# Patient Record
Sex: Female | Born: 1960 | Race: White | Hispanic: No | Smoking: Never smoker
Health system: Southern US, Community
[De-identification: ages and names within clinical notes are randomized; demographics above are authoritative.]

## PROBLEM LIST (undated history)

## (undated) DIAGNOSIS — H43819 Vitreous degeneration, unspecified eye: Secondary | ICD-10-CM

## (undated) DIAGNOSIS — F909 Attention-deficit hyperactivity disorder, unspecified type: Secondary | ICD-10-CM

## (undated) DIAGNOSIS — F32A Depression, unspecified: Secondary | ICD-10-CM

## (undated) DIAGNOSIS — M1990 Unspecified osteoarthritis, unspecified site: Secondary | ICD-10-CM

## (undated) HISTORY — DX: Unspecified osteoarthritis, unspecified site: M19.90

## (undated) HISTORY — DX: Attention-deficit hyperactivity disorder, unspecified type: F90.9

## (undated) HISTORY — DX: Depression, unspecified: F32.A

## (undated) HISTORY — DX: Vitreous degeneration, unspecified eye: H43.819

---
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
## 2000-09-08 HISTORY — PX: STRABISMUS SURGERY: SHX218

## 2015-09-09 HISTORY — PX: TOTAL VAGINAL HYSTERECTOMY: SHX2548

## 2020-02-28 ENCOUNTER — Ambulatory Visit: Payer: 59 | Admitting: Podiatry

## 2020-03-22 ENCOUNTER — Ambulatory Visit (INDEPENDENT_AMBULATORY_CARE_PROVIDER_SITE_OTHER): Payer: 59

## 2020-03-22 ENCOUNTER — Other Ambulatory Visit: Payer: Self-pay

## 2020-03-22 ENCOUNTER — Ambulatory Visit (INDEPENDENT_AMBULATORY_CARE_PROVIDER_SITE_OTHER): Payer: 59 | Admitting: Podiatry

## 2020-03-22 DIAGNOSIS — M2141 Flat foot [pes planus] (acquired), right foot: Secondary | ICD-10-CM

## 2020-03-22 DIAGNOSIS — M779 Enthesopathy, unspecified: Secondary | ICD-10-CM

## 2020-03-22 DIAGNOSIS — M2142 Flat foot [pes planus] (acquired), left foot: Secondary | ICD-10-CM | POA: Diagnosis not present

## 2020-03-22 DIAGNOSIS — M79671 Pain in right foot: Secondary | ICD-10-CM

## 2020-03-22 DIAGNOSIS — M19079 Primary osteoarthritis, unspecified ankle and foot: Secondary | ICD-10-CM | POA: Diagnosis not present

## 2020-03-22 DIAGNOSIS — M79672 Pain in left foot: Secondary | ICD-10-CM

## 2020-03-27 ENCOUNTER — Other Ambulatory Visit: Payer: Self-pay | Admitting: Podiatry

## 2020-03-27 DIAGNOSIS — M779 Enthesopathy, unspecified: Secondary | ICD-10-CM

## 2020-03-27 NOTE — Progress Notes (Signed)
Subjective:   Patient ID: Carmen Coleman, female   DOB: 59 y.o.   MRN: [id number]   HPI 59 year old female presents the office today for concerns of chronic bilateral lower extremity pain mostly to the ankles as well as her flatfeet.  She states that she has difficulties with walking and she has difficulty able to stand for long period of time.  She has tried orthotics and good supportive shoes which helped some but she still gets pain on daily basis mostly to the ankle but the right side worse than left.  This seems to be getting worse over time.  Denies any recent injury or trauma.  She previous has been told that she has arthritis in both of her feet and ankles.   Review of Systems  All other systems reviewed and are negative.  No past medical history on file.  No current outpatient medications on file.  Not on File       Objective:  Physical Exam  General: AAO x3, NAD  Dermatological: Skin is warm, dry and supple bilateral. Nails x 10 are well manicured; remaining integument appears unremarkable at this time. There are no open sores, no preulcerative lesions, no rash or signs of infection present.  Vascular: Dorsalis Pedis artery and Posterior Tibial artery pedal pulses are 2/4 bilateral with immedate capillary fill time. Pedal hair growth present. No varicosities and no lower extremity edema present bilateral. There is no pain with calf compression, swelling, warmth, erythema.   Neruologic: Grossly intact via light touch bilateral.  Musculoskeletal: There is a decrease in medial arch height upon weightbearing bilaterally.  There is discomfort mostly to the anterior aspect of the ankle joint with the right side worse than left.  There is decreased range of motion of the ankle joint.  Decreased abduction range of motion.  No areas of pinpoint tenderness otherwise.  Muscular strength 5/5 in all groups tested bilateral.  Gait: Unassisted, Nonantalgic.      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  Flatfoot is evident with arthritic changes present the midfoot as well as the ankle joint.  Not able to appreciate any evidence of acute fracture. -Steroid injection performed to the right ankle.  See procedure note below. -I will have her follow-up with Raiford Noble for possible bracing.  She has tried orthotics without significant improvement most of her pain seems to be in the ankle itself.  Procedure: Injection Intermediate Joint Discussed alternatives, risks, complications and verbal consent was obtained.  Location: Right ankle  Skin Prep: Betadine. Injectate: 0.5cc 0.5% marcaine plain, 0.5 cc 2% lidocaine plain and, 1 cc kenalog 10. Disposition: Patient tolerated procedure well. Injection site dressed with a band-aid.  Post-injection care was discussed and return precautions discussed.   No follow-ups on file.  Vivi Barrack DPM

## 2020-04-02 ENCOUNTER — Ambulatory Visit: Payer: 59 | Admitting: Orthotics

## 2020-04-02 ENCOUNTER — Other Ambulatory Visit: Payer: Self-pay

## 2020-04-02 DIAGNOSIS — M779 Enthesopathy, unspecified: Secondary | ICD-10-CM

## 2020-04-02 DIAGNOSIS — M19079 Primary osteoarthritis, unspecified ankle and foot: Secondary | ICD-10-CM

## 2020-04-02 DIAGNOSIS — M2142 Flat foot [pes planus] (acquired), left foot: Secondary | ICD-10-CM

## 2020-04-02 DIAGNOSIS — M2141 Flat foot [pes planus] (acquired), right foot: Secondary | ICD-10-CM | POA: Diagnosis not present

## 2020-04-02 NOTE — Progress Notes (Addendum)
Patient presents today for ankle AFO (gauntlet type)  to address chronic mid foot/rear foot OA pain.   Patient is fairly rigid in transverse/saggital planes.  Brace will be more accomodative type to lock foot/ankle in place and limit painful joint movement

## 2020-04-04 ENCOUNTER — Other Ambulatory Visit: Payer: Self-pay

## 2020-04-04 ENCOUNTER — Ambulatory Visit (HOSPITAL_COMMUNITY)
Admission: EM | Admit: 2020-04-04 | Discharge: 2020-04-04 | Disposition: A | Discharge status: Home / Self Care | Payer: 59

## 2020-04-04 DIAGNOSIS — Z760 Encounter for issue of repeat prescription: Secondary | ICD-10-CM | POA: Diagnosis not present

## 2020-04-04 DIAGNOSIS — G4700 Insomnia, unspecified: Secondary | ICD-10-CM | POA: Diagnosis not present

## 2020-04-04 DIAGNOSIS — F411 Generalized anxiety disorder: Secondary | ICD-10-CM

## 2020-04-04 NOTE — BH Assessment (Signed)
Comprehensive Clinical Assessment (CCA) Screening, Triage and Referral Note  04/04/2020 Carmen Coleman [id number]   Patient is a 58 y.o. female with a history of ADHD and R/O diagnosis if Bipolar Disorder (which she questions) who presents to [facility] requesting a prescription for Vyvanse.  Patient has been prescribed Vyvanse for ADHD for 12 or more years by her PCP in [place].  She recently resigned from [employer] in [place] and moved to [place].  When she contacted Dr. Manson Passey for a refill, she learned that he is "no longer allowed to prescribe vyvanse" per the [facility] instructions.  Patient states she is also prescribed Trazadone, which she doesn't take daily and has refills for.  She reports she has been diagnosed with Bipolar Disorder in the past, and had tried multiple medications that were not helpful.  As such, she does not agree with this diagnosis.  She reports she has been depressed for "years" and the only medication that has helped has been Vyvanse.  She states it relieves symptoms of anxiety and depression, while also improving her ability to focus. Patient denies SI, HI and AVH.  She does present with hypomanic symptoms to include, loud/pressured speech, tangential thought process, difficulty focusing on multiple tasks at once and racing thoughts.  She attributes symptoms to ADHD.    Patient has been trying to find a provider here in [place], however she has had difficulty finding someone who could see her before her Rx runs out tomorrow.  Also, her [facility] insurance coverage ends on 04/07/20, so she was hoping to get a refill before insurance lapses.  She contacted [facility] today and was transferred to the [facility].  She states someone here told her that she could get a prescription for this medication, a controlled substance.  Patient was informed by Marciano Sequin, NP that Vyvanse could not be prescribed at urgent care/walk-in clinic. Patient has been scheduled for an intake appointment  with [facility] clinic for 8/20 at 11AM.  She is informed she may contact staff tomorrow, as it is after hours currently, to discuss being considered for a sooner appointment.    Visit Diagnosis:    ICD-10-CM   1. Medication refill  Z76.0     Patient Reported Information How did you hear about [place]? Self   Referral name: No data recorded  Referral phone number: No data recorded Whom do you see for routine medical problems? I don't have a doctor   Practice/Facility Name: No data recorded  Practice/Facility Phone Number: No data recorded  Name of Contact: No data recorded  Contact Number: No data recorded  Contact Fax Number: No data recorded  Prescriber Name: No data recorded  Prescriber Address (if known): No data recorded What Is the Reason for Your Visit/Call Today? Patient reports she will run out of vyvanse she has been prescribed for the pat 12 yrs.  She states her provider in [place] can't continue to prescribe and she requests a prescription.  How Long Has This Been Causing You Problems? <Week  Have You Recently Been in Any Inpatient Treatment (Hospital/Detox/Crisis Center/28-Day Program)? No   Name/Location of Program/Hospital:No data recorded  How Long Were You There? No data recorded  When Were You Discharged? No data recorded Have You Ever Received Services From [facility] Before? No   Who Do You See at [facility]? No data recorded Have You Recently Had Any Thoughts About Hurting Yourself? No   Are You Planning to Commit Suicide/Harm Yourself At  This time?  No  Have you Recently Had Thoughts About Hurting Someone Karolee Ohs? No   Explanation: No data recorded Have You Used Any Alcohol or Drugs in the Past 24 Hours? No   How Long Ago Did You Use Drugs or Alcohol?  No data recorded  What Did You Use and How Much? No data recorded What Do You Feel Would Help You the Most Today? Medication  Do You Currently Have a Therapist/Psychiatrist?  No   Name of Therapist/Psychiatrist: No data recorded  Have You Been Recently Discharged From Any Office Practice or Programs? Yes   Explanation of Discharge From Practice/Program:  Provider, Darlis Loan in [place], no longer "allowed" to rx vyvanse per DEA.     CCA Screening Triage Referral Assessment Type of Contact: Face-to-Face   Is this Initial or Reassessment? No data recorded  Date Telepsych consult ordered in [facility]:  No data recorded  Time Telepsych consult ordered in [facility]:  No data recorded Patient Reported Information Reviewed? Yes   Patient Left Without Being Seen? No data recorded  Reason for Not Completing Assessment: No data recorded Collateral Involvement: N/A  Does Patient Have a Court Appointed Legal Guardian? No data recorded  Name and Contact of Legal Guardian:  No data recorded If Minor and Not Living with Parent(s), Who has Custody? No data recorded Is CPS involved or ever been involved? Never  Is APS involved or ever been involved? Never  Patient Determined To Be At Risk for Harm To Self or Others Based on Review of Patient Reported Information or Presenting Complaint? No   Method: No data recorded  Availability of Means: No data recorded  Intent: No data recorded  Notification Required: No data recorded  Additional Information for Danger to Others Potential:  No data recorded  Additional Comments for Danger to Others Potential:  No data recorded  Are There Guns or Other Weapons in Your Home?  No data recorded   Types of Guns/Weapons: No data recorded   Are These Weapons Safely Secured?                              No data recorded   Who Could Verify You Are Able To Have These Secured:    No data recorded Do You Have any Outstanding Charges, Pending Court Dates, Parole/Probation? No data recorded Contacted To Inform of Risk of Harm To Self or Others: No data recorded Location of Assessment: [facility]  Does Patient Present under  Involuntary Commitment? No   IVC Papers Initial File Date: No data recorded  [place] of Residence: [place]  Patient Currently Receiving the Following Services: No data recorded  Determination of Need: Routine (7 days)   Options For Referral: Medication Management   Yetta Glassman

## 2020-04-04 NOTE — ED Provider Notes (Signed)
Behavioral Health Medical Screening Exam  Carmen Coleman is a 59 y.o. female. She is presenting requesting prescription for Vyvanse. She states she recently quit her job in [place] and moved to [place] to live with her sister. She states her PCP in [place] will fill her other prescriptions over the phone but refused to fill Vyvanse prescription.  Patient appears hypomanic, with pressured, loud speech and tangential thinking. She reports history of diagnosis with bipolar disorder but "I don't have that," and states Vyvanse is the only medication that helps her. She strongly denies any SI/HI/AVH.   Patient advised that we do not prescribe controlled substances through walk-in clinic but can provide referrals for outpatient medication management. She reports that she will be losing her health insurance on July 31 due to recently quitting her job.  Total Time spent with patient: 15 minutes  Psychiatric Specialty Exam  Presentation  General Appearance:Casual  Eye Contact:Good  Speech:Pressured  Speech Volume:Increased  Handedness:Right   Mood and Affect  Mood:Anxious;Labile  Affect:Congruent   Thought Process  Thought Processes:Coherent  Descriptions of Associations:Tangential  Orientation:Full (Time, Place and Person)  Thought Content:Logical  Hallucinations:None  Ideas of Reference:None  Suicidal Thoughts:No  Homicidal Thoughts:No   Sensorium  Memory:Immediate Good;Recent Good;Remote Good  Judgment:Fair  Insight:Fair   Executive Functions  Concentration:Fair  Attention Span:Fair  Recall:Fair  Fund of Knowledge:Fair  Language:Fair   Psychomotor Activity  Psychomotor Activity:Normal   Assets  Assets:Communication Skills;Desire for Improvement;Housing;Social Support   Sleep  Sleep:Fair  Number of hours: No data recorded  Physical Exam: Physical Exam Vitals reviewed.  Constitutional:      Appearance: She is well-developed.   Cardiovascular:     Rate and Rhythm: Normal rate.  Pulmonary:     Effort: Pulmonary effort is normal.  Neurological:     Mental Status: She is alert and oriented to person, place, and time.    Review of Systems  Constitutional: Negative.   Respiratory: Negative for cough and shortness of breath.   Cardiovascular: Negative for chest pain.  Psychiatric/Behavioral: Positive for depression (chronic). Negative for hallucinations, memory loss, substance abuse and suicidal ideas. The patient is nervous/anxious and has insomnia.    Blood pressure (!) 136/76, pulse 98, temperature 98.6 F (37 C), temperature source Tympanic, height 5' 4.5" (1.638 m), weight (!) 210 lb (95.3 kg), SpO2 99 %. Body mass index is 35.49 kg/m.  Musculoskeletal: Strength & Muscle Tone: within normal limits Gait & Station: normal Patient leans: N/A   Recommendations:  Based on my evaluation the patient does not appear to have an emergency medical condition.  Outpatient referrals for medication management.  Aldean Baker, NP 04/04/2020, 5:00 PM

## 2020-04-05 ENCOUNTER — Telehealth: Payer: Self-pay | Admitting: Podiatry

## 2020-04-05 NOTE — Telephone Encounter (Signed)
Left message for pt that the braces does not need auth and covered @ 80% after deductible.

## 2020-04-09 NOTE — Telephone Encounter (Signed)
Pt called asking about the benefits for the brace.   I explained I had left her a message that they are covered @ 80% after deductible and no auth was needed. Pt wants to proceed with the braces and I told her I would call when they come in to schedule an appt to pick them up.

## 2020-04-27 ENCOUNTER — Telehealth (HOSPITAL_COMMUNITY): Payer: 59

## 2020-05-01 ENCOUNTER — Telehealth: Payer: Self-pay | Admitting: Podiatry

## 2020-05-01 NOTE — Telephone Encounter (Signed)
Pt left 2 messages 1 @ 218 and 1 @ 216 asking for a call back.   I returned call and left message for pt to call me tomorrow.

## 2020-05-04 ENCOUNTER — Telehealth: Payer: Self-pay | Admitting: Podiatry

## 2020-05-04 NOTE — Telephone Encounter (Signed)
Pt left message asking for a call back.  I returned call and left message it looks like it was returning a call to schedule an appt to pick up brace and she called back and is already scheduled. But if she needs any other assistance to please call me back.

## 2020-06-04 ENCOUNTER — Ambulatory Visit: Payer: 59 | Admitting: Orthotics

## 2020-06-04 ENCOUNTER — Other Ambulatory Visit: Payer: Self-pay

## 2020-06-04 DIAGNOSIS — M779 Enthesopathy, unspecified: Secondary | ICD-10-CM

## 2020-06-04 DIAGNOSIS — M2142 Flat foot [pes planus] (acquired), left foot: Secondary | ICD-10-CM

## 2020-06-04 DIAGNOSIS — M2141 Flat foot [pes planus] (acquired), right foot: Secondary | ICD-10-CM

## 2020-06-04 DIAGNOSIS — M19079 Primary osteoarthritis, unspecified ankle and foot: Secondary | ICD-10-CM

## 2020-06-04 NOTE — Progress Notes (Signed)
Patient picked up b/l [place] gauntlet style braces.  She was very pleased with fit and trim.

## 2020-06-26 ENCOUNTER — Telehealth (INDEPENDENT_AMBULATORY_CARE_PROVIDER_SITE_OTHER): Payer: 59 | Admitting: Psychiatry

## 2020-06-26 ENCOUNTER — Encounter (HOSPITAL_COMMUNITY): Payer: Self-pay | Admitting: Psychiatry

## 2020-06-26 ENCOUNTER — Other Ambulatory Visit: Payer: Self-pay

## 2020-06-26 DIAGNOSIS — F419 Anxiety disorder, unspecified: Secondary | ICD-10-CM

## 2020-06-26 DIAGNOSIS — F339 Major depressive disorder, recurrent, unspecified: Secondary | ICD-10-CM | POA: Insufficient documentation

## 2020-06-26 DIAGNOSIS — F902 Attention-deficit hyperactivity disorder, combined type: Secondary | ICD-10-CM | POA: Diagnosis not present

## 2020-06-26 MED ORDER — TRAZODONE HCL 50 MG PO TABS: 50.0000 mg | ORAL_TABLET | Freq: Every day | ORAL | 1 refills | Status: DC

## 2020-06-26 MED ORDER — LISDEXAMFETAMINE DIMESYLATE 40 MG PO CAPS: 40.0000 mg | ORAL_CAPSULE | ORAL | 0 refills | Status: DC

## 2020-06-26 NOTE — Progress Notes (Signed)
Psychiatric Initial Adult Assessment  Virtual Visit via Video Note  I connected with Carmen Coleman on 06/26/20 at 10:10 AM EDT by a video enabled telemedicine application and verified that I am speaking with the correct person using two identifiers.  Location: Patient: Home Provider: Clinic   I discussed the limitations of evaluation and management by telemedicine and the availability of in person appointments. The patient expressed understanding and agreed to proceed.  I provided 25 minutes of non-face-to-face time during this encounter.      Patient Identification: Carmen Coleman MRN:  [id number] Date of Evaluation:  06/26/2020   Referral Source: [facility]  Chief Complaint:   " I need refills on my Vyvanse."  Visit Diagnosis:    ICD-10-CM   1. Attention deficit hyperactivity disorder (ADHD), combined type  F90.2 lisdexamfetamine (VYVANSE) 40 MG capsule    lisdexamfetamine (VYVANSE) 40 MG capsule  2. Anxiety  F41.9 traZODone (DESYREL) 50 MG tablet    History of Present Illness: This is a 59 year old female with history of ADHD, anxiety, depression now seen for evaluation.  Patient reported that she was living in [place] for many years and recently moved down here to live with her sister few months ago.  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  Her job ended in July and eventually she lost her insurance as well. She decided to move here to live with her sister.  She informed that she has also worked with Child psychotherapist in the past. Patient was noted to be very talkative and circumstantial.  She interrupted the Clinical research associate several times and apologized.  She was a bit flustered because she got busy in doing something and was rushing to get to the clinic for the appointment and did not even realize that she was supposed to have a virtual visit. She reported that she got anxiety and depression when she was in graduate school in  [place].  At that time she was prescribed Zoloft which helped partially but not completely.  She did fine without any medicine for a few years and then eventually developed depression symptoms again.  She has been tried on Cymbalta, Celexa, Prozac in the past however none of the medicine seemed to help her symptoms completely.  She also is reported that her antidepressant was augmented with Abilify and Geodon at different point of time and none of them really made a difference at all. She stated that she always struggled with difficulty in focusing and is always been forgetful and careless.  She easily loses her belongings.  She cannot stay on track.  Her mind wanders easily and she gets distracted very easily. Eventually her PCP in [place] is the one who prescribed her with Vyvanse to see if that would make a difference.  She stated that as soon as she started taking it there was a huge difference in her ability to stay on track and complete her tasks.  Her productivity increased significantly in her professional and personal life.  She has been taking Vyvanse for 12 years now.  She also informed that she has had difficulty with sleeping and she is also on trazodone 50 mg at bedtime prescribed by the same PCP.  Her PCP is unable to send the prescriptions for Vyvanse being controlled substance to [place] but has been able to fill her prescription for trazodone. Patient stated that because she gets very anxious at times and therefore she starts talking fast and  people wonder if she has mania however she denied any manic symptoms like elevated mood or increased energy levels with decreased need for sleep.  She denied any grandiose delusions.  She denied any ongoing symptom suggestive of depression like anhedonia, depressed mood, crying spells, poor appetite.  She denies any psychotic symptoms.  She denies any excessive consumption of alcohol or illicit use of drugs.  She stated that she really  wants to be able to get back on Vyvanse so that she can accomplish her tasks properly without getting too many things.  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  She is working with a young man in sharpening his English-speaking abilities.  She stated that she wants to use her experiences social worker from the past to be able to help these people navigate through the new system.   Past Psychiatric History: Anxiety, ADHD, Depression  Previous Psychotropic Medications: Yes  - Sertraline, Celexa, Cymbalta, Prozac, Abilify, Geodon   Substance Abuse History in the last 12 months:  No.  Consequences of Substance Abuse: NA  Past Medical History: History reviewed. No pertinent past medical history. History reviewed. No pertinent surgical history.  Family Psychiatric History: denied  Family History: History reviewed. No pertinent family history.  Social History:   Social History   Socioeconomic History  . Marital status: Single    Spouse name: Not on file  . Number of children: Not on file  . Years of education: Not on file  . Highest education level: Not on file  Occupational History  . Not on file  Tobacco Use  . Smoking status: Not on file  Substance and Sexual Activity  . Alcohol use: Not on file  . Drug use: Not on file  . Sexual activity: Not on file  Other Topics Concern  . Not on file  Social History Narrative  . Not on file   Social Determinants of Health   Financial Resource Strain:   . Difficulty of Paying Living Expenses: Not on file  Food Insecurity:   . Worried About Programme researcher, broadcasting/film/video in the Last Year: Not on file  . Ran Out of Food in the Last Year: Not on file  Transportation Needs:   . Lack of Transportation (Medical): Not on file  . Lack of Transportation (Non-Medical): Not on file  Physical Activity:   . Days of Exercise per Week: Not on file  . Minutes of Exercise per  Session: Not on file  Stress:   . Feeling of Stress : Not on file  Social Connections:   . Frequency of Communication with Friends and Family: Not on file  . Frequency of Social Gatherings with Friends and Family: Not on file  . Attends Religious Services: Not on file  . Active Member of Clubs or Organizations: Not on file  . Attends Banker Meetings: Not on file  . Marital Status: Not on file    Additional Social History: Currently lives with sister, looking for a job, is doing volunteering work  Allergies:  Not on File  Metabolic Disorder Labs: No results found for: HGBA1C, MPG No results found for: PROLACTIN No results found for: CHOL, TRIG, HDL, CHOLHDL, VLDL, LDLCALC No results found for: TSH  Therapeutic Level Labs: No results found for: LITHIUM No results found for: CBMZ No results found for: VALPROATE  Current Medications: Current Outpatient Medications  Medication Sig Dispense Refill  . lisdexamfetamine (  VYVANSE) 40 MG capsule Take 1 capsule (40 mg total) by mouth every morning. 30 capsule 0  . traZODone (DESYREL) 50 MG tablet Take 1 tablet (50 mg total) by mouth at bedtime. 30 tablet 1  . [START ON 07/26/2020] lisdexamfetamine (VYVANSE) 40 MG capsule Take 1 capsule (40 mg total) by mouth every morning. 30 capsule 0   No current facility-administered medications for this visit.    Psychiatric Specialty Exam: Review of Systems  There were no vitals taken for this visit.There is no height or weight on file to calculate BMI.  General Appearance: Well Groomed  Eye Contact:  Good  Speech:  Clear and Coherent and rapid, Talkative +  Volume:  Normal  Mood:  Euthymic  Affect:  Congruent  Thought Process:  Goal Directed and Descriptions of Associations: Circumstantial  Orientation:  Full (Time, Place, and Person)  Thought Content:  Logical  Suicidal Thoughts:  No  Homicidal Thoughts:  No  Memory:  Immediate;   Good Recent;   Good  Judgement:  Fair   Insight:  Fair  Psychomotor Activity:  Normal  Concentration:  Concentration: Good and Attention Span: Good  Recall:  Good  Fund of Knowledge:Good  Language: Good  Akathisia:  Negative  Handed:  Right  AIMS (if indicated):  Not done  Assets:  Communication Skills Desire for Improvement Financial Resources/Insurance Housing  ADL's:  Intact  Cognition: WNL  Sleep:  Good   Screening: Adult ADHD ASRS scale conducted during the session-scored high on areas indicating inattention and hyperactivity.  Assessment and Plan: Based on patient's history, scores on adult ASRS scale and good response to Vyvanse in the past will resume Vyvanse at 40 mg which was the dose that was being prescribed to her by her PCP in [place].  1. Attention deficit hyperactivity disorder (ADHD), combined type - Continue lisdexamfetamine (VYVANSE) 40 MG capsule; Take 1 capsule (40 mg total) by mouth every morning.  Dispense: 30 capsule; Refill: 0 - lisdexamfetamine (VYVANSE) 40 MG capsule; Take 1 capsule (40 mg total) by mouth every morning.  Dispense: 30 capsule; Refill: 0  2. Anxiety  - Continue traZODone (DESYREL) 50 MG tablet; Take 1 tablet (50 mg total) by mouth at bedtime.  Dispense: 30 tablet; Refill: 1   F/up in 2 months.  Zena Amos, MD 10/19/202111:47 AM

## 2020-08-17 ENCOUNTER — Telehealth (HOSPITAL_COMMUNITY): Payer: 59 | Admitting: Psychiatry

## 2020-08-20 ENCOUNTER — Encounter (HOSPITAL_COMMUNITY): Payer: Self-pay | Admitting: Psychiatry

## 2020-08-20 ENCOUNTER — Other Ambulatory Visit: Payer: Self-pay

## 2020-08-20 ENCOUNTER — Telehealth (INDEPENDENT_AMBULATORY_CARE_PROVIDER_SITE_OTHER): Payer: 59 | Admitting: Psychiatry

## 2020-08-20 DIAGNOSIS — F902 Attention-deficit hyperactivity disorder, combined type: Secondary | ICD-10-CM | POA: Diagnosis not present

## 2020-08-20 DIAGNOSIS — F419 Anxiety disorder, unspecified: Secondary | ICD-10-CM

## 2020-08-20 MED ORDER — TRAZODONE HCL 50 MG PO TABS: 50.0000 mg | ORAL_TABLET | Freq: Every day | ORAL | 2 refills | Status: DC

## 2020-08-20 MED ORDER — LISDEXAMFETAMINE DIMESYLATE 40 MG PO CAPS: 40.0000 mg | ORAL_CAPSULE | ORAL | 0 refills | Status: DC

## 2020-08-20 MED ORDER — LISDEXAMFETAMINE DIMESYLATE 40 MG PO CAPS
40.0000 mg | ORAL_CAPSULE | ORAL | 0 refills | Status: DC
Start: 2020-10-20 — End: 2020-11-13

## 2020-08-20 NOTE — Progress Notes (Signed)
BH OP Progress Note   Virtual Visit via Telephone Note  I connected with Carmen Coleman on 08/20/20 at 11:20 AM EST by telephone and verified that I am speaking with the correct person using two identifiers.  Location: Patient: Mom's home in [place] Provider: Clinic   I discussed the limitations, risks, security and privacy concerns of performing an evaluation and management service by telephone and the availability of in person appointments. I also discussed with the patient that there may be a patient responsible charge related to this service. The patient expressed understanding and agreed to proceed.   I provided 15 minutes of non-face-to-face time during this encounter.       Patient Identification: Carmen Coleman MRN:  [id number] Date of Evaluation:  08/20/2020    Chief Complaint:   " I am doing well."  Visit Diagnosis:    ICD-10-CM   1. Attention deficit hyperactivity disorder (ADHD), combined type  F90.2   2. Anxiety  F41.9     History of Present Illness: Pt reported that she is currently visiting her mom in [place]. She informed that prior to leaving [place] for the visit she was staying very busy.  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  She stated that she really enjoyed her time working with those people and she is hoping that she can get a full-time job with recommendation for employment purposes.  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  She stated that she is almost out of her savings and really needs a job. She also informed that before leaving for [place] she had moved out of her sister's place in [place] and it started living in [place].  She stated that because of her running around the dog's work she was physically exhausted and was not really taking good care of herself.  She complained of a lot of pain and physical symptoms. She feels  that taking a break with her mother in [place] is helpful and that when she gets back to [facility] area she is on starting for PCP as she has been told in the past that she has arthritis.  She also mentions some other physical conditions which she needs to be addressed with the help of her PCP. She denies any other concerns at this time.  She informed that she will be returning back to [place] in early January and will go without Vyvanse for the remaining month.  Past Psychiatric History: Anxiety, ADHD, Depression  Previous Psychotropic Medications: Yes  - Sertraline, Celexa, Cymbalta, Prozac, Abilify, Geodon  Past Medical History: No past medical history on file. No past surgical history on file.  Family Psychiatric History: denied  Family History: No family history on file.  Social History:   Social History   Socioeconomic History  . Marital status: Single    Spouse name: Not on file  . Number of children: Not on file  . Years of education: Not on file  . Highest education level: Not on file  Occupational History  . Not on file  Tobacco Use  . Smoking status: Not on file  . Smokeless tobacco: Not on file  Substance and Sexual Activity  . Alcohol use: Not on file  . Drug use: Not on file  . Sexual activity: Not on file  Other Topics Concern  . Not on file  Social History Narrative  .  Not on file   Social Determinants of Health   Financial Resource Strain: Not on file  Food Insecurity: Not on file  Transportation Needs: Not on file  Physical Activity: Not on file  Stress: Not on file  Social Connections: Not on file    Additional Social History: Currently lives with sister, looking for a job, is doing volunteering work  Allergies:  Not on File  Metabolic Disorder Labs: No results found for: HGBA1C, MPG No results found for: PROLACTIN No results found for: CHOL, TRIG, HDL, CHOLHDL, VLDL, LDLCALC No results found for: TSH  Therapeutic Level  Labs: No results found for: LITHIUM No results found for: CBMZ No results found for: VALPROATE  Current Medications: Current Outpatient Medications  Medication Sig Dispense Refill  . lisdexamfetamine (VYVANSE) 40 MG capsule Take 1 capsule (40 mg total) by mouth every morning. 30 capsule 0  . lisdexamfetamine (VYVANSE) 40 MG capsule Take 1 capsule (40 mg total) by mouth every morning. 30 capsule 0  . traZODone (DESYREL) 50 MG tablet Take 1 tablet (50 mg total) by mouth at bedtime. 30 tablet 1   No current facility-administered medications for this visit.    Psychiatric Specialty Exam: Review of Systems  There were no vitals taken for this visit.There is no height or weight on file to calculate BMI.  General Appearance: unable to assess due to phone visit  Eye Contact:  unable to assess due to phone visit  Speech:  Clear and Coherent and rapid, Talkative +  Volume:  Normal  Mood:  Euthymic  Affect:  Congruent  Thought Process:  Goal Directed and Descriptions of Associations: Circumstantial  Orientation:  Full (Time, Place, and Person)  Thought Content:  Logical  Suicidal Thoughts:  No  Homicidal Thoughts:  No  Memory:  Immediate;   Good Recent;   Good  Judgement:  Fair  Insight:  Fair  Psychomotor Activity:  Normal  Concentration:  Concentration: Good and Attention Span: Good  Recall:  Good  Fund of Knowledge:Good  Language: Good  Akathisia:  Negative  Handed:  Right  AIMS (if indicated):  Not done  Assets:  Communication Skills Desire for Improvement Financial Resources/Insurance Housing  ADL's:  Intact  Cognition: WNL  Sleep:  Good   Screening: Adult ADHD ASRS scale conducted during the session-scored high on areas indicating inattention and hyperactivity.  Assessment and Plan: Patient seems to be doing fairly well in terms of her symptoms of ADHD and sleep.  She mention being physically exhausted due to volunteer work she was doing prior to leaving [place]  for the break.  She is returning back in early January and is hoping to find a full-time job by then.   1. Attention deficit hyperactivity disorder (ADHD), combined type - Continue lisdexamfetamine (VYVANSE) 40 MG capsule; Take 1 capsule (40 mg total) by mouth every morning.  Dispense: 30 capsule; Refill: 0 - lisdexamfetamine (VYVANSE) 40 MG capsule; Take 1 capsule (40 mg total) by mouth every morning.  Dispense: 30 capsule; Refill: 0  2. Anxiety  - Continue traZODone (DESYREL) 50 MG tablet; Take 1 tablet (50 mg total) by mouth at bedtime.  Dispense: 30 tablet; Refill: 1  Continue same regimen. F/up in 3 months.  Zena Amos, MD 12/13/202111:27 AM

## 2020-10-13 ENCOUNTER — Other Ambulatory Visit (HOSPITAL_COMMUNITY): Payer: Self-pay | Admitting: Psychiatry

## 2020-10-13 DIAGNOSIS — F419 Anxiety disorder, unspecified: Secondary | ICD-10-CM

## 2020-11-13 ENCOUNTER — Other Ambulatory Visit: Payer: Self-pay

## 2020-11-13 ENCOUNTER — Telehealth (INDEPENDENT_AMBULATORY_CARE_PROVIDER_SITE_OTHER): Payer: 59 | Admitting: Psychiatry

## 2020-11-13 ENCOUNTER — Encounter (HOSPITAL_COMMUNITY): Payer: Self-pay | Admitting: Psychiatry

## 2020-11-13 DIAGNOSIS — F419 Anxiety disorder, unspecified: Secondary | ICD-10-CM | POA: Diagnosis not present

## 2020-11-13 DIAGNOSIS — F902 Attention-deficit hyperactivity disorder, combined type: Secondary | ICD-10-CM | POA: Diagnosis not present

## 2020-11-13 MED ORDER — LISDEXAMFETAMINE DIMESYLATE 40 MG PO CAPS: 40.0000 mg | ORAL_CAPSULE | ORAL | 0 refills | Status: DC

## 2020-11-13 MED ORDER — TRAZODONE HCL 50 MG PO TABS
50.0000 mg | ORAL_TABLET | Freq: Every evening | ORAL | 2 refills | Status: DC | PRN
Start: 2020-11-13 — End: 2021-02-11

## 2020-11-13 NOTE — Progress Notes (Signed)
BH OP Progress Note   Virtual Visit via Telephone Note  I connected with [name] on 11/13/20 at 11:20 AM EST by telephone and verified that I am speaking with the correct person using two identifiers.  Location: Patient: home Provider: Clinic   I discussed the limitations, risks, security and privacy concerns of performing an evaluation and management service by telephone and the availability of in person appointments. I also discussed with the patient that there may be a patient responsible charge related to this service. The patient expressed understanding and agreed to proceed.   I provided 15 minutes of non-face-to-face time during this encounter.      Patient Identification: Carmen Coleman MRN:  [id number] Date of Evaluation:  11/13/2020    Chief Complaint:   " Things are going well."  Visit Diagnosis:    ICD-10-CM   1. Attention deficit hyperactivity disorder (ADHD), combined type  F90.2   2. Anxiety  F41.9     History of Present Illness: Patient informed that she returned back to [facility] and January.  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 She stated that she is very happy to be in this position.  She stated that now she has a stable monthly check coming in and she is now looking into getting into an apartment of her own so that she can have her own private space.  She feels that her job is very fulfilling and she is able to avoid unnecessary stress.  She stated that she now knows when to see no so that stress does not get to her.  She is avoiding any conflictual situations and that is why she is able to maintain a stress-free routine. She stated that she feels the medicines are really helpful and she would like to keep them the way they are.  She stated that  couple of months ago she was not taking the Vyvanse regularly however for the past 2 months she has been taking it every day and that has made a big difference for her. She feels she is doing much better personally and professionally and would like to keep things the way they are. She denied any symptoms of depression or anxiety in the past couple of months.   Past Psychiatric History: Anxiety, ADHD, Depression  Previous Psychotropic Medications: Yes  - Sertraline, Celexa, Cymbalta, Prozac, Abilify, Geodon  Past Medical History: No past medical history on file. No past surgical history on file.  Family Psychiatric History: denied  Family History: No family history on file.  Social History:   Social History   Socioeconomic History  . Marital status: Single    Spouse name: Not on file  . Number of children: Not on file  . Years of education: Not on file  . Highest education level: Not on file  Occupational History  . Not on file  Tobacco Use  . Smoking status: Not on file  . Smokeless tobacco: Not on file  Substance and Sexual Activity  . Alcohol use: Not on file  . Drug use: Not on file  . Sexual activity: Not on file  Other Topics Concern  . Not on file  Social History Narrative  . Not on file   Social Determinants of Health   Financial Resource Strain: Not on file  Food Insecurity: Not on file  Transportation Needs: Not on file  Physical Activity: Not on file  Stress: Not on file  Social Connections: Not on file    Additional Social History: Currently lives with sister, looking for a job, is doing volunteering work  Allergies:  Not on File  Metabolic Disorder Labs: No results found for: HGBA1C, MPG No results found for: PROLACTIN No results found for: CHOL, TRIG, HDL, CHOLHDL, VLDL, LDLCALC No results found for: TSH  Therapeutic Level Labs: No results found for: LITHIUM No results found for: CBMZ No results found for: VALPROATE  Current  Medications: Current Outpatient Medications  Medication Sig Dispense Refill  . lisdexamfetamine (VYVANSE) 40 MG capsule Take 1 capsule (40 mg total) by mouth every morning. 30 capsule 0  . lisdexamfetamine (VYVANSE) 40 MG capsule Take 1 capsule (40 mg total) by mouth every morning. 30 capsule 0  . lisdexamfetamine (VYVANSE) 40 MG capsule Take 1 capsule (40 mg total) by mouth every morning. 30 capsule 0  . traZODone (DESYREL) 50 MG tablet TAKE 1 TABLET(50 MG) BY MOUTH AT BEDTIME 30 tablet 2   No current facility-administered medications for this visit.    Psychiatric Specialty Exam: Review of Systems  There were no vitals taken for this visit.There is no height or weight on file to calculate BMI.  General Appearance: unable to assess due to phone visit  Eye Contact:  unable to assess due to phone visit  Speech:  Clear and Coherent and Normal Rate  Volume:  Normal  Mood:  Euthymic  Affect:  Congruent  Thought Process:  Goal Directed and Descriptions of Associations: Circumstantial  Orientation:  Full (Time, Place, and Person)  Thought Content:  Logical  Suicidal Thoughts:  No  Homicidal Thoughts:  No  Memory:  Immediate;   Good Recent;   Good  Judgement:  Fair  Insight:  Fair  Psychomotor Activity:  Normal  Concentration:  Concentration: Good and Attention Span: Good  Recall:  Good  Fund of Knowledge:Good  Language: Good  Akathisia:  Negative  Handed:  Right  AIMS (if indicated):  Not done  Assets:  Communication Skills Desire for Improvement Financial Resources/Insurance Housing  ADL's:  Intact  Cognition: WNL  Sleep:  Good    Assessment and Plan: Patient seems to be stable at present.  She is satisfied with her current job position and is now looking for a personal apartment of her own to rent.  We will keep the same regimen for now.    1. Attention deficit hyperactivity disorder (ADHD), combined type  - lisdexamfetamine (VYVANSE) 40 MG capsule; Take 1 capsule (40  mg total) by mouth every morning.  Dispense: 30 capsule; Refill: 0 - lisdexamfetamine (VYVANSE) 40 MG capsule; Take 1 capsule (40 mg total) by mouth every morning.  Dispense: 30 capsule; Refill: 0 - lisdexamfetamine (VYVANSE) 40 MG capsule; Take 1 capsule (40 mg total) by mouth every morning.  Dispense: 30 capsule; Refill: 0  2. Anxiety  - traZODone (DESYREL) 50 MG tablet; Take 1 tablet (50 mg total) by mouth at bedtime as needed for sleep.  Dispense: 30 tablet; Refill: 2  Continue same regimen. F/up in 3 months.  Zena Amos, MD 3/8/202211:13 AM

## 2021-02-06 ENCOUNTER — Telehealth: Payer: Self-pay | Admitting: Podiatry

## 2021-02-06 NOTE — Telephone Encounter (Signed)
I have not seen her in almost a year. If she needs that she would need to come in to be seen or she can get it from her PCP.

## 2021-02-06 NOTE — Telephone Encounter (Signed)
Patient has requested renewal for handicap plaque, Please Advise

## 2021-02-06 NOTE — Telephone Encounter (Signed)
Will inform, thanks

## 2021-02-11 ENCOUNTER — Telehealth (INDEPENDENT_AMBULATORY_CARE_PROVIDER_SITE_OTHER): Payer: 59 | Admitting: Psychiatry

## 2021-02-11 ENCOUNTER — Other Ambulatory Visit: Payer: Self-pay

## 2021-02-11 ENCOUNTER — Encounter (HOSPITAL_COMMUNITY): Payer: Self-pay | Admitting: Psychiatry

## 2021-02-11 DIAGNOSIS — F419 Anxiety disorder, unspecified: Secondary | ICD-10-CM

## 2021-02-11 DIAGNOSIS — F902 Attention-deficit hyperactivity disorder, combined type: Secondary | ICD-10-CM | POA: Diagnosis not present

## 2021-02-11 MED ORDER — LISDEXAMFETAMINE DIMESYLATE 40 MG PO CAPS: 40.0000 mg | ORAL_CAPSULE | ORAL | 0 refills | Status: DC

## 2021-02-11 MED ORDER — TRAZODONE HCL 50 MG PO TABS: 50.0000 mg | ORAL_TABLET | Freq: Every evening | ORAL | 2 refills | Status: DC | PRN

## 2021-02-11 NOTE — Progress Notes (Signed)
BH OP Progress Note   Virtual Visit via Video Note  I connected with Carmen Coleman on 02/11/21 at 11:00 AM EDT by a video enabled telemedicine application and verified that I am speaking with the correct person using two identifiers.  Location: Patient: Home Provider: Clinic   I discussed the limitations of evaluation and management by telemedicine and the availability of in person appointments. The patient expressed understanding and agreed to proceed.  I provided 18 minutes of non-face-to-face time during this encounter.     Patient Identification: Carmen Coleman MRN:  [id number] Date of Evaluation:  02/11/2021    Chief Complaint:   " I have a lot on my mind."  Visit Diagnosis:    ICD-10-CM   1. Attention deficit hyperactivity disorder (ADHD), combined type  F90.2   2. Anxiety  F41.9     History of Present Illness: Patient states that she has been really stressed lately.  She stated that first of all she is very constipated and that is really bothering her.  She then stated that she quit her job a few weeks ago because she found out that it was not really what she was expecting it to be.  She spoke about how things are really out of control at her workplace and she is happy that she quit that position. She stated that she was trying to look for another place to live independently away from her sister however due to the inflation and financial constraints she still living with her sister.  Unfortunately that has not worked out well because lately her sister has been having company almost every single day of the week and patient feels that she has no control of people being in her place.  She stated that she has no privacy therefore sometimes she just leaves the house in her car and parks somewhere to get some privacy. She is planning to visit her elderly mother in [place] soon.  She informed that she had to put down her dog of 15 years in March and she has been missing her dog a  lot.  She stated that she does not take the trazodone regularly but then sometimes she will take 2 tablets at night and then gets a good night sleep.  She stated that she needs to work on taking it regularly as it is beneficial for her.  She ruminated about some other things and then stated that she would like to keep the same regimen for now.  Past Psychiatric History: Anxiety, ADHD, Depression  Previous Psychotropic Medications: Yes  - Sertraline, Celexa, Cymbalta, Prozac, Abilify, Geodon  Past Medical History: No past medical history on file. No past surgical history on file.  Family Psychiatric History: denied  Family History: No family history on file.  Social History:   Social History   Socioeconomic History  . Marital status: Single    Spouse name: Not on file  . Number of children: Not on file  . Years of education: Not on file  . Highest education level: Not on file  Occupational History  . Not on file  Tobacco Use  . Smoking status: Never Smoker  . Smokeless tobacco: Never Used  Substance and Sexual Activity  . Alcohol use: Never  . Drug use: Never  . Sexual activity: Not Currently  Other Topics Concern  . Not on file  Social History Narrative  . Not on file   Social Determinants of Health   Financial Resource Strain: Not on file  Food Insecurity: Not on file  Transportation Needs: Not on file  Physical Activity: Not on file  Stress: Not on file  Social Connections: Not on file    Additional Social History: Currently lives with sister, looking for a job, is doing volunteering work  Allergies:  Not on File  Metabolic Disorder Labs: No results found for: HGBA1C, MPG No results found for: PROLACTIN No results found for: CHOL, TRIG, HDL, CHOLHDL, VLDL, LDLCALC No results found for: TSH  Therapeutic Level Labs: No results found for: LITHIUM No results found for: CBMZ No results found for: VALPROATE  Current Medications: Current Outpatient  Medications  Medication Sig Dispense Refill  . lisdexamfetamine (VYVANSE) 40 MG capsule Take 1 capsule (40 mg total) by mouth every morning. 30 capsule 0  . lisdexamfetamine (VYVANSE) 40 MG capsule Take 1 capsule (40 mg total) by mouth every morning. 30 capsule 0  . lisdexamfetamine (VYVANSE) 40 MG capsule Take 1 capsule (40 mg total) by mouth every morning. 30 capsule 0  . traZODone (DESYREL) 50 MG tablet Take 1 tablet (50 mg total) by mouth at bedtime as needed for sleep. 30 tablet 2   No current facility-administered medications for this visit.    Psychiatric Specialty Exam: Review of Systems  There were no vitals taken for this visit.There is no height or weight on file to calculate BMI.  General Appearance: Fairly groomed  Eye Contact:  Good  Speech:  Clear and Coherent and Normal Rate, talkative   Volume:  Normal  Mood:  Anxious  Affect:  Congruent  Thought Process:  Goal Directed and Descriptions of Associations: Circumstantial  Orientation:  Full (Time, Place, and Person)  Thought Content:  Logical and Rumination  Suicidal Thoughts:  No  Homicidal Thoughts:  No  Memory:  Immediate;   Good Recent;   Good  Judgement:  Fair  Insight:  Fair  Psychomotor Activity:  Normal  Concentration:  Concentration: Good and Attention Span: Good  Recall:  Good  Fund of Knowledge:Good  Language: Good  Akathisia:  Negative  Handed:  Right  AIMS (if indicated):  Not done  Assets:  Communication Skills Desire for Improvement Financial Resources/Insurance Housing  ADL's:  Intact  Cognition: WNL  Sleep:  Fair    Assessment and Plan: Patient is stressed due to several ongoing events in her life.  She is planning to visit her mother in [place] soon.   1. Attention deficit hyperactivity disorder (ADHD), combined type  - lisdexamfetamine (VYVANSE) 40 MG capsule; Take 1 capsule (40 mg total) by mouth every morning.  Dispense: 30 capsule; Refill: 0 - lisdexamfetamine (VYVANSE) 40 MG  capsule; Take 1 capsule (40 mg total) by mouth every morning.  Dispense: 30 capsule; Refill: 0 - lisdexamfetamine (VYVANSE) 40 MG capsule; Take 1 capsule (40 mg total) by mouth every morning.  Dispense: 30 capsule; Refill: 0  2. Anxiety  - traZODone (DESYREL) 50 MG tablet; Take 1 tablet (50 mg total) by mouth at bedtime as needed for sleep.  Dispense: 30 tablet; Refill: 2  Continue same regimen. F/up in 3 months.  Patient was informed that writer is leaving the office and therefore she will be seeing a different provider at the time for next visit.  She verbalized understanding.  Zena Amos, MD 6/6/202211:17 AM

## 2021-02-28 ENCOUNTER — Ambulatory Visit (INDEPENDENT_AMBULATORY_CARE_PROVIDER_SITE_OTHER): Payer: 59 | Admitting: Internal Medicine

## 2021-02-28 ENCOUNTER — Other Ambulatory Visit: Payer: Self-pay

## 2021-02-28 ENCOUNTER — Encounter: Payer: Self-pay | Admitting: Internal Medicine

## 2021-02-28 VITALS — BP 110/67 | HR 87 | Temp 98.7°F | Ht 64.0 in | Wt 213.8 lb

## 2021-02-28 DIAGNOSIS — Z0000 Encounter for general adult medical examination without abnormal findings: Secondary | ICD-10-CM | POA: Diagnosis not present

## 2021-02-28 DIAGNOSIS — Z9189 Other specified personal risk factors, not elsewhere classified: Secondary | ICD-10-CM

## 2021-02-28 DIAGNOSIS — Z114 Encounter for screening for human immunodeficiency virus [HIV]: Secondary | ICD-10-CM | POA: Diagnosis not present

## 2021-02-28 DIAGNOSIS — M19079 Primary osteoarthritis, unspecified ankle and foot: Secondary | ICD-10-CM

## 2021-02-28 DIAGNOSIS — E669 Obesity, unspecified: Secondary | ICD-10-CM

## 2021-02-28 DIAGNOSIS — E782 Mixed hyperlipidemia: Secondary | ICD-10-CM

## 2021-02-28 DIAGNOSIS — F339 Major depressive disorder, recurrent, unspecified: Secondary | ICD-10-CM

## 2021-02-28 DIAGNOSIS — Z7689 Persons encountering health services in other specified circumstances: Secondary | ICD-10-CM

## 2021-02-28 DIAGNOSIS — H43813 Vitreous degeneration, bilateral: Secondary | ICD-10-CM

## 2021-02-28 DIAGNOSIS — K581 Irritable bowel syndrome with constipation: Secondary | ICD-10-CM

## 2021-02-28 DIAGNOSIS — Z1231 Encounter for screening mammogram for malignant neoplasm of breast: Secondary | ICD-10-CM

## 2021-02-28 DIAGNOSIS — Z1159 Encounter for screening for other viral diseases: Secondary | ICD-10-CM

## 2021-02-28 NOTE — Patient Instructions (Signed)
It was a pleasure meeting you today!  I will contact you once your labs return.  For urgent needs after regular clinic hours, you may call the hospital operator to speak with the on-call provider. For all other, non-urgent needs, you can reach [place] by calling [phone] or via MyChart.

## 2021-02-28 NOTE — Progress Notes (Signed)
ROI form faxed to [facility] for medical records.

## 2021-03-01 ENCOUNTER — Encounter: Payer: Self-pay | Admitting: Internal Medicine

## 2021-03-01 DIAGNOSIS — Z0000 Encounter for general adult medical examination without abnormal findings: Secondary | ICD-10-CM | POA: Insufficient documentation

## 2021-03-01 DIAGNOSIS — K589 Irritable bowel syndrome without diarrhea: Secondary | ICD-10-CM | POA: Insufficient documentation

## 2021-03-01 DIAGNOSIS — M19079 Primary osteoarthritis, unspecified ankle and foot: Secondary | ICD-10-CM | POA: Insufficient documentation

## 2021-03-01 DIAGNOSIS — E66812 Obesity, class 2: Secondary | ICD-10-CM | POA: Insufficient documentation

## 2021-03-01 DIAGNOSIS — H43813 Vitreous degeneration, bilateral: Secondary | ICD-10-CM | POA: Insufficient documentation

## 2021-03-01 DIAGNOSIS — E669 Obesity, unspecified: Secondary | ICD-10-CM | POA: Insufficient documentation

## 2021-03-01 DIAGNOSIS — E782 Mixed hyperlipidemia: Secondary | ICD-10-CM | POA: Insufficient documentation

## 2021-03-01 LAB — CBC WITH DIFFERENTIAL/PLATELET
Basophils Absolute: 0 10*3/uL (ref 0.0–0.2)
Basos: 1 %
EOS (ABSOLUTE): 0.1 10*3/uL (ref 0.0–0.4)
Eos: 1 %
Hematocrit: 40.3 % (ref 34.0–46.6)
Hemoglobin: 13.2 g/dL (ref 11.1–15.9)
Immature Grans (Abs): 0 10*3/uL (ref 0.0–0.1)
Immature Granulocytes: 1 %
Lymphocytes Absolute: 1.3 10*3/uL (ref 0.7–3.1)
Lymphs: 23 %
MCH: 28.6 pg (ref 26.6–33.0)
MCHC: 32.8 g/dL (ref 31.5–35.7)
MCV: 87 fL (ref 79–97)
Monocytes Absolute: 0.3 10*3/uL (ref 0.1–0.9)
Monocytes: 5 %
Neutrophils Absolute: 4 10*3/uL (ref 1.4–7.0)
Neutrophils: 69 %
Platelets: 383 10*3/uL (ref 150–450)
RBC: 4.61 x10E6/uL (ref 3.77–5.28)
RDW: 12.6 % (ref 11.7–15.4)
WBC: 5.8 10*3/uL (ref 3.4–10.8)

## 2021-03-01 LAB — CMP14 + ANION GAP
ALT: 23 IU/L (ref 0–32)
AST: 20 IU/L (ref 0–40)
Albumin/Globulin Ratio: 2.2 (ref 1.2–2.2)
Albumin: 4.4 g/dL (ref 3.8–4.9)
Alkaline Phosphatase: 101 IU/L (ref 44–121)
Anion Gap: 14 mmol/L (ref 10.0–18.0)
BUN/Creatinine Ratio: 10 — ABNORMAL LOW (ref 12–28)
BUN: 7 mg/dL — ABNORMAL LOW (ref 8–27)
Bilirubin Total: 0.5 mg/dL (ref 0.0–1.2)
CO2: 23 mmol/L (ref 20–29)
Calcium: 9.3 mg/dL (ref 8.7–10.3)
Chloride: 104 mmol/L (ref 96–106)
Creatinine, Ser: 0.68 mg/dL (ref 0.57–1.00)
Globulin, Total: 2 g/dL (ref 1.5–4.5)
Glucose: 104 mg/dL — ABNORMAL HIGH (ref 65–99)
Potassium: 5 mmol/L (ref 3.5–5.2)
Sodium: 141 mmol/L (ref 134–144)
Total Protein: 6.4 g/dL (ref 6.0–8.5)
eGFR: 100 mL/min/{1.73_m2} (ref >59)

## 2021-03-01 LAB — LIPID PANEL
Chol/HDL Ratio: 3.9 ratio (ref 0.0–4.4)
Cholesterol, Total: 260 mg/dL — ABNORMAL HIGH (ref 100–199)
HDL: 67 mg/dL (ref >39)
LDL Chol Calc (NIH): 168 mg/dL — ABNORMAL HIGH (ref 0–99)
Triglycerides: 138 mg/dL (ref 0–149)
VLDL Cholesterol Cal: 25 mg/dL (ref 5–40)

## 2021-03-01 LAB — HIV ANTIBODY (ROUTINE TESTING W REFLEX): HIV Screen 4th Generation wRfx: NONREACTIVE

## 2021-03-01 LAB — HEPATITIS C ANTIBODY: Hep C Virus Ab: <0.1 s/co ratio (ref 0.0–0.9)

## 2021-03-01 MED ORDER — DICLOFENAC SODIUM 1 % EX GEL: 2.0000 g | Freq: Four times a day (QID) | CUTANEOUS | 3 refills | Status: AC | PRN

## 2021-03-01 MED ORDER — ESCITALOPRAM OXALATE 10 MG PO TABS: 10.0000 mg | ORAL_TABLET | Freq: Every day | ORAL | 1 refills | Status: DC

## 2021-03-01 NOTE — Assessment & Plan Note (Addendum)
Chronic and stable. Pain is managed with oral and topical NSAIDS and braces.

## 2021-03-01 NOTE — Assessment & Plan Note (Signed)
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 She manages this with increased fiber intake.

## 2021-03-01 NOTE — Assessment & Plan Note (Addendum)
Total cholesterol 260, HDL 67, LDL 168.  No diabetes, tobacco use, hypertension, or family history of heart disease. BMI 36  Assessment: 10Y ASCVD is 3.2%. Statin therapy is not indicated at this time. Plan -repeat lipid panel in 1 year

## 2021-03-01 NOTE — Assessment & Plan Note (Signed)
Arthritis is the primary barrier to increasing physical activity. Discussed dietary measures to work on weight loss.

## 2021-03-01 NOTE — Progress Notes (Signed)
New Patient Office Visit  Subjective:  Patient ID: Carmen Coleman, female    DOB: 11/05/1960  Age: 60 y.o. MRN: [id number]  CC:  Chief Complaint  Patient presents with   Follow-up    PATIENT NEW TO CLINIC TO GET ESTABLISHED    HPI [name] is a 60 year old female with ADHD, bilateral flatfoot deformities, ankle arthritis, and viteous detachment who presents for establishment of care.  Social history She moved from [place] in 2021 to be closer to her family in [place].  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 She denies drug or tobacco use. Reports occasional alcohol use; <2 drinks per month.  ADHD, Depression She follows with psychiatry and has been on vyvanse which works well for her. She used it consistently when she used to work but how just uses it for tasks that require her to stay on track. She also reports struggling on and off with depression. She had previously been on lexapro which she felt worked well for her, but has been off this for a while now. She feels that her mood as been more depressed lately, due to social issues. She denies SI.  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 She was seen by podiatry for this last year who had recommended surgical correction, however warned her that it would require multiple procedures. She felt that, at her age, she did not wish to undergo extensive surgeries, and declined. Podiatry offered ankles braces which they fitted for her last year.  She manages the arthritic pain with oral and topical NSAIDs.   Vitreous Detachments of bilateral eyes This has been a recurrent problem for her and she had undergone some kind of procedure a few years ago. Over the past year, the floaters have worsened again. She  has an appointment with ophthalmology tomorrow for re-evaluation.  Past Medical History:  Diagnosis Date   ADHD    Arthritis    Depression    Vitreous detachment     Past Surgical History:  Procedure Laterality Date   APPENDECTOMY  1968   STRABISMUS SURGERY  1963   STRABISMUS SURGERY  2002   TOTAL VAGINAL HYSTERECTOMY  2017    Family History  Problem Relation Age of Onset   Hypertension Father    Alzheimer's disease Father    Pulmonary embolism Sister    Diabetes Maternal Grandmother     Social History   Socioeconomic History   Marital status: Widowed    Spouse name: Not on file   Number of children: Not on file   Years of education: Not on file   Highest education level: Bachelor's degree (e.g., BA, AB, BS)  Occupational History   Not on file  Tobacco Use   Smoking status: Never   Smokeless tobacco: Never  Substance and Sexual Activity   Alcohol use: Never   Drug use: Never   Sexual activity: Not Currently  Other Topics Concern   Not on file  Social History Narrative   Moved to [place] from [place] in 2021. Retired Child psychotherapist.    Social Determinants of Health   Financial Resource Strain: Not on file  Food Insecurity: Not on file  Transportation Needs: Not on file  Physical Activity: Not on file  Stress: Not on file  Social Connections: Not on file  Intimate Partner Violence: Not on file    ROS Review of Systems  Constitutional:  Negative for appetite change and unexpected weight change.  HENT: Negative.    Eyes:  Positive for visual disturbance. Negative for pain and itching.  Respiratory: Negative.    Cardiovascular: Negative.   Gastrointestinal: Negative.   Endocrine: Negative.   Genitourinary: Negative.   Musculoskeletal:  Positive for arthralgias and gait problem.  Neurological:  Negative for dizziness and headaches.  Psychiatric/Behavioral:  Negative for self-injury and suicidal ideas.    Objective:   Today's Vitals: BP 110/67  (BP Location: Right Arm, Patient Position: Sitting, Cuff Size: Normal)   Pulse 87   Temp 98.7 F (37.1 C) (Oral)   Ht 5\' 4"  (1.626 m)   Wt 213 lb 12.8 oz (97 kg)   SpO2 98%   BMI 36.70 kg/m   Physical Exam General: well appearing HENT: normocephalic, MMM Eyes: Right sided strabismus, no conjunctival redness Cardiac: RRR, no LE edema Pulm: breathing comfortably on room air, lungs clear GI: non-distended, no tenderness Neuro: a/o x3. No focal deficits MSK: genu valgum, out toeing bilaterally, ankle braces present bilaterally Skin: no rash or lesion on limited exam Psych: normal affect, denies SI  Assessment & Plan:   Problem List Items Addressed This Visit       Digestive   IBS (irritable bowel syndrome) (Chronic)    She reports having been diagnosed with IBS in the past and struggles off and on with constipation. She manages this with increased fiber intake.         Musculoskeletal and Integument   Ankle arthritis (Chronic)    Chronic and stable. Pain is managed with oral and topical NSAIDS and braces.          Other   Major depression, recurrent (HCC) (Chronic)    She reports previously being managed on lexapro which she felt worked well, however she has not been on it for some time. PHQ was 13 today and pt wished to be put back on lexapro.  -started back on lexapro 10mg  today. She will reach out if no improvement in 6 weeks, otherwise will follow up in 3 months       Relevant Medications   escitalopram (LEXAPRO) 10 MG tablet   Healthcare maintenance (Chronic)    Mammogram ordered at today's visit. Declines COVID vaccination. She will think about pneumonia vaccination in the future. Encouraged to undergo shingles vaccination Last colonoscopy was in 2018 which she reports was normal and was to repeat in 10Y--awaiting records from prior PCP HIV/HCV screened today       Vitreous detachment of both eyes (Chronic)    This is a chronic issue for her. She follows  with ophthalmology for management. Floaters have been worse over the past year so she is being re-evaluated tomorrow by ophthalmology.       Moderate mixed hyperlipidemia not requiring statin therapy    Total cholesterol 260, HDL 67, LDL 168.  No diabetes, tobacco use, hypertension, or family history of heart disease. BMI 36  Assessment: 10Y ASCVD is 3.2%. Statin therapy is not indicated at this time. Plan -repeat lipid panel in 1 year       Class 2 obesity    Arthritis is the primary barrier to increasing physical activity. Discussed dietary measures to work on weight loss.       Other Visit Diagnoses     Encounter for screening mammogram for malignant neoplasm of breast    -  Primary   Relevant Orders   MM Digital Screening   Encounter for annual physical exam       Relevant Orders   CBC with Diff (Completed)   CMP14 + Anion Gap (Completed)   Lipid panel (Completed)   HIV antibody (with reflex) (Completed)   Hepatitis C antibody (Completed)   Encounter to establish care       Relevant Orders   CBC with Diff (Completed)   CMP14 + Anion Gap (Completed)   Lipid panel (Completed)   HIV antibody (with reflex) (Completed)   Hepatitis C antibody (Completed)   Screening for HIV (human immunodeficiency virus)       Relevant Orders   HIV antibody (with reflex) (Completed)   Encounter for HCV screening test for high risk patient       Relevant Orders   Hepatitis C antibody (Completed)       Outpatient Encounter Medications as of 02/28/2021  Medication Sig   diclofenac Sodium (VOLTAREN) 1 % GEL Apply 2 g topically 4 (four) times daily as needed.   escitalopram (LEXAPRO) 10 MG tablet Take 1 tablet (10 mg total) by mouth daily.   lisdexamfetamine (VYVANSE) 40 MG capsule Take 1 capsule (40 mg total) by mouth every morning.   [START ON 03/13/2021] lisdexamfetamine (VYVANSE) 40 MG capsule Take 1 capsule (40 mg total) by mouth every morning.   [START ON 04/12/2021] lisdexamfetamine  (VYVANSE) 40 MG capsule Take 1 capsule (40 mg total) by mouth every morning.   traZODone (DESYREL) 50 MG tablet Take 1 tablet (50 mg total) by mouth at bedtime as needed for sleep.   No facility-administered encounter medications on file as of 02/28/2021.    Follow-up: Return in about 1 year (around 02/28/2022).   Lab results and recommendations were communicated to the patient.  Patient discussed with Dr. Rejeana Brock, MD Internal Medicine Resident PGY-2 [facility] Pager: [phone] 03/01/2021 11:08 AM

## 2021-03-01 NOTE — Assessment & Plan Note (Signed)
This is a chronic issue for her. She follows with ophthalmology for management. Floaters have been worse over the past year so she is being re-evaluated tomorrow by ophthalmology.

## 2021-03-01 NOTE — Assessment & Plan Note (Signed)
Mammogram ordered at today's visit. Declines COVID vaccination. She will think about pneumonia vaccination in the future. Encouraged to undergo shingles vaccination Last colonoscopy was in 2018 which she reports was normal and was to repeat in 10Y--awaiting records from prior PCP HIV/HCV screened today

## 2021-03-01 NOTE — Assessment & Plan Note (Signed)
She reports previously being managed on lexapro which she felt worked well, however she has not been on it for some time. PHQ was 13 today and pt wished to be put back on lexapro.  -started back on lexapro 10mg  today. She will reach out if no improvement in 6 weeks, otherwise will follow up in 3 months

## 2021-03-04 ENCOUNTER — Encounter: Payer: 59 | Admitting: Internal Medicine

## 2021-03-11 ENCOUNTER — Encounter (HOSPITAL_COMMUNITY): Payer: Self-pay

## 2021-03-11 ENCOUNTER — Ambulatory Visit (INDEPENDENT_AMBULATORY_CARE_PROVIDER_SITE_OTHER): Payer: Federal, State, Local not specified - PPO

## 2021-03-11 ENCOUNTER — Ambulatory Visit (HOSPITAL_COMMUNITY)
Admission: EM | Admit: 2021-03-11 | Discharge: 2021-03-11 | Disposition: A | Discharge status: Home / Self Care | Payer: Federal, State, Local not specified - PPO | Attending: Family Medicine | Admitting: Family Medicine

## 2021-03-11 ENCOUNTER — Other Ambulatory Visit: Payer: Self-pay

## 2021-03-11 ENCOUNTER — Encounter: Payer: Self-pay | Admitting: Internal Medicine

## 2021-03-11 DIAGNOSIS — R059 Cough, unspecified: Secondary | ICD-10-CM | POA: Diagnosis not present

## 2021-03-11 DIAGNOSIS — R0602 Shortness of breath: Secondary | ICD-10-CM

## 2021-03-11 DIAGNOSIS — F319 Bipolar disorder, unspecified: Secondary | ICD-10-CM | POA: Insufficient documentation

## 2021-03-11 NOTE — ED Triage Notes (Signed)
Pt presents with ongoing post nasal drainage,  non productive cough, intermittent shortness of breath, and chest & back pain with coughing X 3 weeks.

## 2021-03-11 NOTE — Discharge Instructions (Addendum)
You have been seen at the [facility] today for shortness of breath with exertion. Your ECG (heart tracing) and chest x-ray did not show any worrisome changes. However, some medical problems make take more time to appear. Therefore, it's very important that you pay attention to any new symptoms or worsening of your current condition.  Please proceed directly to the Emergency Department immediately should you feel worse in any way or have any of the following symptoms: chest pain, pain that spreads from your chest to your arm, neck, jaw, back or abdomen, worsening shortness of breath, or nausea and vomiting.

## 2021-03-12 ENCOUNTER — Encounter: Payer: Self-pay | Admitting: *Deleted

## 2021-03-13 NOTE — ED Provider Notes (Signed)
[facility]   [id number] 03/11/21 Arrival Time: 1157  ASSESSMENT & PLAN:  1. Shortness of breath   2. Cough     ECG: Performed today and interpreted by me: normal EKG, normal sinus rhythm. No STEMI.  I have personally viewed the imaging studies ordered this visit. Normal CXR. No pneumothorax.    Discharge Instructions      You have been seen at the [facility] today for shortness of breath with exertion. Your ECG (heart tracing) and chest x-ray did not show any worrisome changes. However, some medical problems make take more time to appear. Therefore, it's very important that you pay attention to any new symptoms or worsening of your current condition.  Please proceed directly to the Emergency Department immediately should you feel worse in any way or have any of the following symptoms: chest pain, pain that spreads from your chest to your arm, neck, jaw, back or abdomen, worsening shortness of breath, or nausea and vomiting.     She is comfortable with home observation.   Recommend:  Follow-up Information     Schedule an appointment as soon as possible for a visit  with Elige Radon, MD.   Specialty: Internal Medicine Contact information: [address] [place] [address] [phone]         [facility] EMERGENCY DEPARTMENT.   Specialty: Emergency Medicine Why: If symptoms worsen in any way. Contact information: [address] [id number] m[address] [place] [address] [phone]                  Chest pain precautions given. Reviewed expectations re: course of current medical issues. Questions answered. Outlined signs and symptoms indicating need for more acute intervention. Patient verbalized understanding. After Visit Summary given.   SUBJECTIVE:  History from: patient. Carmen Coleman is a 60 y.o. female who reports ongoing post nasal drainage,  non productive cough,  intermittent shortness of breath, and chest & back pain with coughing X 3 weeks. No specific CP except when coughing. Afebrile. Feels mild SOB with exertion mainly.  Social History   Tobacco Use  Smoking Status Never  Smokeless Tobacco Never   Social History   Substance and Sexual Activity  Alcohol Use Never    OBJECTIVE:  Vitals:   03/11/21 1210  BP: 106/77  Pulse: 89  Resp: 17  Temp: 98.4 F (36.9 C)  TempSrc: Oral  SpO2: 98%    General appearance: alert, oriented, no acute distress Eyes: PERRLA; EOMI; conjunctivae normal HENT: normocephalic; atraumatic Neck: supple with FROM Lungs: without labored respirations; speaks full sentences without difficulty; CTAB Heart: regular Chest Coleman: without tenderness to palpation Abdomen: soft, non-tender; no guarding or rebound tenderness Extremities: without edema; without calf swelling or tenderness; symmetrical without gross deformities Skin: warm and dry; without rash or lesions Neuro: normal gait Psychological: alert and cooperative; normal mood and affect   Allergies  Allergen Reactions   Bactrim [Sulfamethoxazole-Trimethoprim]     Past Medical History:  Diagnosis Date   ADHD    Arthritis    Depression    Vitreous detachment    Social History   Socioeconomic History   Marital status: Widowed    Spouse name: Not on file   Number of children: Not on file   Years of education: Not on file   Highest education level: Bachelor's degree (e.g., BA, AB, BS)  Occupational History   Not on file  Tobacco Use   Smoking status:  Never   Smokeless tobacco: Never  Substance and Sexual Activity   Alcohol use: Never   Drug use: Never   Sexual activity: Not Currently  Other Topics Concern   Not on file  Social History Narrative   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    Social Determinants of Health   Financial Resource Strain: Not on file  Food Insecurity: Not on file  Transportation  Needs: Not on file  Physical Activity: Not on file  Stress: Not on file  Social Connections: Not on file  Intimate Partner Violence: Not on file   Family History  Problem Relation Age of Onset   Hypertension Father    Alzheimer's disease Father    Heart failure Father    Coronary artery disease Father    Pulmonary embolism Sister    Diabetes Maternal Grandmother    Lymphoma Other    Breast cancer Cousin    Past Surgical History:  Procedure Laterality Date   APPENDECTOMY  1968   STRABISMUS SURGERY  1963   STRABISMUS SURGERY  2002   TOTAL VAGINAL HYSTERECTOMY  2017      Mardella Layman, MD 03/13/21 [id number]

## 2021-03-14 ENCOUNTER — Ambulatory Visit (INDEPENDENT_AMBULATORY_CARE_PROVIDER_SITE_OTHER): Payer: Federal, State, Local not specified - PPO | Admitting: Internal Medicine

## 2021-03-14 ENCOUNTER — Encounter: Payer: Self-pay | Admitting: Internal Medicine

## 2021-03-14 ENCOUNTER — Other Ambulatory Visit: Payer: Self-pay

## 2021-03-14 VITALS — BP 132/76 | HR 88 | Temp 98.0°F | Resp 24 | Ht 64.0 in | Wt 214.0 lb

## 2021-03-14 DIAGNOSIS — J069 Acute upper respiratory infection, unspecified: Secondary | ICD-10-CM | POA: Diagnosis not present

## 2021-03-14 DIAGNOSIS — G4700 Insomnia, unspecified: Secondary | ICD-10-CM

## 2021-03-14 DIAGNOSIS — F32A Depression, unspecified: Secondary | ICD-10-CM

## 2021-03-14 NOTE — Patient Instructions (Signed)
Thank you for visiting the Internal Medicine Clinic today. It was a pleasure to meet you!  Today we discussed your recent visit to urgent care for shortness of breath, sinus drainage, back and chest discomfort, and postnasal drip. At this time, you admit to be feeling better each day. I am so glad!   So long as you continue to feel better, I do not feel that you need further referral or work-up. You are welcome to treat your lingering cough with over-the-counter cough medication. I recommend that you continue to get good rest and allow your body time to recover from this illness.   Please do not hesitate to call the office with any concerns! Dr. August Saucer

## 2021-03-14 NOTE — Assessment & Plan Note (Signed)
Patient states that she is continuing to experience less symptoms each day. I have advised her to strive for adequate rest and allow her body time to recover from her illness. She may use over-the-counter cough medication if needed for her lingering cough.

## 2021-03-14 NOTE — Progress Notes (Signed)
CC: follow-up for chest discomfort and URI symptoms  HPI:  [name] is a 60 y.o. female who presents today to follow-up for an urgent care visit on 03/11/2021. She states that beginning in June, she began feeling very poorly with symptoms including severe constipation, nausea, and generalized weakness. In mid-June, she states that she was febrile but took two home COVID-19 tests that resulted as negative. In the past week she began having chest and pain and discomfort, post-nasal drip, cough, shortness of breath, and sore throat, so after encouragement from loved ones she elected to present to urgent care. In this visit a chest x-ray and EKG were completed, both of which were unremarkable. She states that she has chronic insomnia, and with the help of her insomnia medication she has slept well the past three nights and notes that she also has been feeling better each day. She no longer brings up sputum with coughs, cough frequency is decreased, and she feels overall more energized. She complains of ongoing shortness of breath but notes that this occurs with exertion and denies any orthopnea or feelings of air hunger. She describes the shortness of breath as giving more feelings of weariness and energy depletion. She denies concerns of extremity swelling or edema, hemoptysis, or new fever.   She does admit to increased feelings of depression related to relative homelessness, being between jobs, and other large life changes but sees a different clinician for her behavioral health concerns. She feels that some of her symptoms could be related to depressed mood and states that she is actively working to improve lifestyle components to improve her mood like consuming a better diet, getting more rest, and getting more exercise.   Past Medical History:  Diagnosis Date   ADHD    Arthritis    Depression    Vitreous detachment    Review of Systems:  Review of Systems  Constitutional:  Positive  for malaise/fatigue. Negative for fever.  HENT:  Negative for congestion and sore throat.   Respiratory:  Positive for cough. Negative for hemoptysis, sputum production, shortness of breath and stridor.   Cardiovascular:  Positive for chest pain. Negative for orthopnea, claudication and leg swelling.  Gastrointestinal:  Negative for abdominal pain, constipation, nausea and vomiting.  Musculoskeletal:  Positive for back pain.  Psychiatric/Behavioral:  Positive for depression. The patient has insomnia.     Physical Exam:  Vitals:   03/14/21 1015  BP: 132/76  Pulse: 88  Resp: (!) 24  Temp: 98 F (36.7 C)  TempSrc: Oral  SpO2: 98%  Weight: 214 lb (97.1 kg)  Height: 5\' 4"  (1.626 m)   Physical Exam Constitutional:      Appearance: Normal appearance. She is well-developed.  Eyes:     Comments: R eye gaze laterally deviated  Cardiovascular:     Rate and Rhythm: Normal rate and regular rhythm.     Heart sounds: Normal heart sounds.  Pulmonary:     Effort: Pulmonary effort is normal.     Breath sounds: Normal breath sounds.  Musculoskeletal:     Right upper arm: No swelling or edema.     Left upper arm: No swelling or edema.     Right forearm: No swelling or edema.     Left forearm: No swelling or edema.     Right lower leg: No swelling. No edema.     Left lower leg: No swelling. No edema.  Skin:    General: Skin is warm and dry.  Nails: There is no clubbing.  Neurological:     General: No focal deficit present.     Mental Status: She is alert.  Psychiatric:        Attention and Perception: Attention and perception normal.        Mood and Affect: Mood and affect normal.        Speech: Speech normal.        Behavior: Behavior normal. Behavior is cooperative.        Thought Content: Thought content normal.        Cognition and Memory: Cognition and memory normal.        Judgment: Judgment normal.     Assessment & Plan:   See Encounters Tab for problem based  charting.  Patient seen with Dr. Criselda Peaches

## 2021-03-21 NOTE — Addendum Note (Signed)
Addended by: Debe Coder B on: 03/21/2021 10:35 AM   Modules accepted: Level of Service

## 2021-03-21 NOTE — Progress Notes (Signed)
Internal Medicine Clinic Attending  I saw and evaluated the patient.  I personally confirmed the key portions of the history and exam documented by Dr.  Dean  and I reviewed pertinent patient test results.  The assessment, diagnosis, and plan were formulated together and I agree with the documentation in the resident's note.  

## 2021-03-25 NOTE — Progress Notes (Signed)
Internal Medicine Clinic Attending  Case discussed with Dr. Christian  At the time of the visit.  We reviewed the resident's history and exam and pertinent patient test results.  I agree with the assessment, diagnosis, and plan of care documented in the resident's note.  

## 2021-04-11 ENCOUNTER — Ambulatory Visit
Admission: RE | Admit: 2021-04-11 | Discharge: 2021-04-11 | Disposition: A | Discharge status: Home / Self Care | Payer: Federal, State, Local not specified - PPO | Source: Ambulatory Visit | Attending: Internal Medicine | Admitting: Internal Medicine

## 2021-04-11 ENCOUNTER — Other Ambulatory Visit: Payer: Self-pay

## 2021-04-11 DIAGNOSIS — Z1231 Encounter for screening mammogram for malignant neoplasm of breast: Secondary | ICD-10-CM

## 2021-04-16 ENCOUNTER — Telehealth (HOSPITAL_COMMUNITY): Payer: Self-pay | Admitting: *Deleted

## 2021-04-16 NOTE — Telephone Encounter (Signed)
BC/BS  APPROVED PRIOR AUTHORIZATION lisdexamfetamine (VYVANSE) 40 MG capsule AUTHORIZATION VALID: 03/17/2021     THRU   04/16/2022

## 2021-05-09 ENCOUNTER — Telehealth (INDEPENDENT_AMBULATORY_CARE_PROVIDER_SITE_OTHER): Payer: Federal, State, Local not specified - PPO | Admitting: Psychiatry

## 2021-05-09 ENCOUNTER — Other Ambulatory Visit: Payer: Self-pay

## 2021-05-09 ENCOUNTER — Encounter (HOSPITAL_COMMUNITY): Payer: Self-pay | Admitting: Psychiatry

## 2021-05-09 DIAGNOSIS — F419 Anxiety disorder, unspecified: Secondary | ICD-10-CM | POA: Diagnosis not present

## 2021-05-09 DIAGNOSIS — F902 Attention-deficit hyperactivity disorder, combined type: Secondary | ICD-10-CM | POA: Diagnosis not present

## 2021-05-09 MED ORDER — LISDEXAMFETAMINE DIMESYLATE 40 MG PO CAPS: 40.0000 mg | ORAL_CAPSULE | ORAL | 0 refills | Status: DC

## 2021-05-09 MED ORDER — TRAZODONE HCL 50 MG PO TABS: 50.0000 mg | ORAL_TABLET | Freq: Every evening | ORAL | 3 refills | Status: DC | PRN

## 2021-05-09 NOTE — Progress Notes (Signed)
BH MD/PA/NP OP Progress Note Virtual Visit via Telephone Note  I connected with Carmen Coleman on 05/09/21 at 11:30 AM EDT by telephone and verified that I am speaking with the correct person using two identifiers.  Location: Patient: home Provider: Clinic   I discussed the limitations, risks, security and privacy concerns of performing an evaluation and management service by telephone and the availability of in person appointments. I also discussed with the patient that there may be a patient responsible charge related to this service. The patient expressed understanding and agreed to proceed.   I provided 30 minutes of non-face-to-face time during this encounter.  05/09/2021 11:46 AM Carmen Coleman  MRN:  [id number]  Chief Complaint: "The medications are fine"  HPI: 60 year old female seen today for follow-up psychiatric evaluation.  She is a former patient of Dr. Quintella Coleman who is being transferred to writer for medication management.  She has a psychiatric history of depression, anxiety, bipolar disorder, and ADHD.  She is currently managed on Vyvanse 40 mg daily and trazodone 50 to 100 mg nightly.  She notes her medications are effective in managing her psychiatric conditions.  Today she was unable to logon virtually so assessment was done over the phone.  During exam she was pleasant, cooperative, and engaged in conversation.  She informed Clinical research associate that her medications are working fine.  She notes that currently she is in [place] visiting her mother.  Patient notes that her anxiety and depression continues to be minimal.  Provider conducted a GAD-7 and patient scored a 5.  Provider also conducted a PHQ-9 and patient scored a 13.  She endorsed adequate appetite and sleep (noting that she sleeps 6 hours nightly).  Patient notes at times she stays up too late on her phone or watching television.  She informed Clinical research associate that she takes 2 trazodone when this occurs.  Today she denies SI/HI/VAH, mania,  or paranoia.  Patient notes that occasionally she gambles however denies manic-like behaviors and reports that her past diagnosis of bipolar disorder is incorrect.  She informed Clinical research associate that her mother likes to go to casinos and when she takes that she spends more money than she should.  Overall patient notes that she is doing well.  No medication changes made today.  Patient agreeable to continue medications as prescribed. Visit Diagnosis:    ICD-10-CM   1. Attention deficit hyperactivity disorder (ADHD), combined type  F90.2 lisdexamfetamine (VYVANSE) 40 MG capsule    2. Anxiety  F41.9 traZODone (DESYREL) 50 MG tablet      Past Psychiatric History:  Anxiety, ADHD, Depression, and Bipolar disorder  Past Medical History:  Past Medical History:  Diagnosis Date   ADHD    Arthritis    Depression    Vitreous detachment     Past Surgical History:  Procedure Laterality Date   APPENDECTOMY  1968   STRABISMUS SURGERY  1963   STRABISMUS SURGERY  2002   TOTAL VAGINAL HYSTERECTOMY  2017    Family Psychiatric History: denied  Family History:  Family History  Problem Relation Age of Onset   Hypertension Father    Alzheimer's disease Father    Heart failure Father    Coronary artery disease Father    Pulmonary embolism Sister    Diabetes Maternal Grandmother    Lymphoma Other    Breast cancer Cousin     Social History:  Social History   Socioeconomic History   Marital status: Widowed    Spouse name: Not on file  Number of children: Not on file   Years of education: Not on file   Highest education level: Bachelor's degree (e.g., BA, AB, BS)  Occupational History   Not on file  Tobacco Use   Smoking status: Never   Smokeless tobacco: Never  Substance and Sexual Activity   Alcohol use: Never   Drug use: Never   Sexual activity: Not Currently  Other Topics Concern   Not on file  Social History Narrative   Moved to [place] from [place] in 2021. Retired Arts development officer.    Social Determinants of Health   Financial Resource Strain: Not on file  Food Insecurity: Not on file  Transportation Needs: Not on file  Physical Activity: Not on file  Stress: Not on file  Social Connections: Not on file    Allergies:  Allergies  Allergen Reactions   Bactrim [Sulfamethoxazole-Trimethoprim]     Metabolic Disorder Labs: No results found for: HGBA1C, MPG No results found for: PROLACTIN Lab Results  Component Value Date   CHOL 260 (H) 02/28/2021   TRIG 138 02/28/2021   HDL 67 02/28/2021   CHOLHDL 3.9 02/28/2021   LDLCALC 168 (H) 02/28/2021   No results found for: TSH  Therapeutic Level Labs: No results found for: LITHIUM No results found for: VALPROATE No components found for:  CBMZ  Current Medications: Current Outpatient Medications  Medication Sig Dispense Refill   diclofenac Sodium (VOLTAREN) 1 % GEL Apply 2 g topically 4 (four) times daily as needed. 350 g 3   lisdexamfetamine (VYVANSE) 40 MG capsule Take 1 capsule (40 mg total) by mouth every morning. 30 capsule 0   traZODone (DESYREL) 50 MG tablet Take 1 tablet (50 mg total) by mouth at bedtime as needed for sleep. 60 tablet 3   No current facility-administered medications for this visit.     Musculoskeletal: Strength & Muscle Tone:  Unable to assess due to telephone visit Gait & Station: Unable to assess due to telephone visit Patient leans: N/A  Psychiatric Specialty Exam: Review of Systems  There were no vitals taken for this visit.There is no height or weight on file to calculate BMI.  General Appearance:  Unable to assess due to telephone visit  Eye Contact:   Unable to assess due to telephone visit  Speech:  Clear and Coherent and Normal Rate  Volume:  Normal  Mood:  Euthymic  Affect:  Appropriate and Congruent  Thought Process:  Coherent, Goal Directed, and Linear  Orientation:  Full (Time, Place, and Person)  Thought Content: WDL and Logical   Suicidal Thoughts:   No  Homicidal Thoughts:  No  Memory:  Immediate;   Good Recent;   Good Remote;   Good  Judgement:  Good  Insight:  Good  Psychomotor Activity:  Normal  Concentration:  Concentration: Good and Attention Span: Good  Recall:  Good  Fund of Knowledge: Good  Language: Good  Akathisia:   Unable to assess due to telephone visit  Handed:  Right  AIMS (if indicated): not done  Assets:  Communication Skills Desire for Improvement Financial Resources/Insurance Housing Leisure Time Social Support  ADL's:  Intact  Cognition: WNL  Sleep:  Fair   Screenings: GAD-7    Flowsheet Row Video Visit from 05/09/2021 in [facility]  Total GAD-7 Score 5      PHQ2-9    Flowsheet Row Video Visit from 05/09/2021 in [facility] Office Visit from 03/14/2021 in [facility] Office Visit from 02/28/2021 in [facility]  PHQ-2 Total Score 5 4 3   PHQ-9 Total Score 13 10 13       Flowsheet Row ED from 03/11/2021 in [facility] RISK CATEGORY No Risk        Assessment and Plan: Patient notes that she is doing well on her current medication regimen.  No medication changes made today.  Patient agreeable to continue medications as prescribed.  1. Attention deficit hyperactivity disorder (ADHD), combined type  Continue- lisdexamfetamine (VYVANSE) 40 MG capsule; Take 1 capsule (40 mg total) by mouth every morning.  Dispense: 30 capsule; Refill: 0  2. Anxiety  Continue- traZODone (DESYREL) 50 MG tablet; Take 1 tablet (50 mg total) by mouth at bedtime as needed for sleep.  Dispense: 60 tablet; Refill: 3  Follow-up in 3 months   [facility], NP 05/09/2021, 11:46 AM

## 2021-06-21 ENCOUNTER — Other Ambulatory Visit (HOSPITAL_COMMUNITY): Payer: Self-pay | Admitting: Psychiatry

## 2021-06-21 DIAGNOSIS — F419 Anxiety disorder, unspecified: Secondary | ICD-10-CM

## 2021-06-21 DIAGNOSIS — F902 Attention-deficit hyperactivity disorder, combined type: Secondary | ICD-10-CM

## 2021-06-21 MED ORDER — LISDEXAMFETAMINE DIMESYLATE 40 MG PO CAPS: 40.0000 mg | ORAL_CAPSULE | ORAL | 0 refills | Status: DC

## 2021-06-21 NOTE — Telephone Encounter (Signed)
Vyvanse refilled and sent to preferred pharmacy.

## 2021-08-15 ENCOUNTER — Encounter (HOSPITAL_COMMUNITY): Payer: Self-pay | Admitting: Psychiatry

## 2021-08-15 ENCOUNTER — Telehealth (INDEPENDENT_AMBULATORY_CARE_PROVIDER_SITE_OTHER): Payer: Federal, State, Local not specified - PPO | Admitting: Psychiatry

## 2021-08-15 DIAGNOSIS — F902 Attention-deficit hyperactivity disorder, combined type: Secondary | ICD-10-CM

## 2021-08-15 DIAGNOSIS — F419 Anxiety disorder, unspecified: Secondary | ICD-10-CM | POA: Diagnosis not present

## 2021-08-15 MED ORDER — TRAZODONE HCL 150 MG PO TABS: 150.0000 mg | ORAL_TABLET | Freq: Every evening | ORAL | 3 refills | Status: AC | PRN

## 2021-08-15 MED ORDER — LISDEXAMFETAMINE DIMESYLATE 40 MG PO CAPS: 40.0000 mg | ORAL_CAPSULE | ORAL | 0 refills | Status: AC

## 2021-08-15 NOTE — Progress Notes (Signed)
BH MD/PA/NP OP Progress Note Virtual Visit via Video Note  I connected with Carmen Coleman on 08/15/21 at 10:00 AM EST by a video enabled telemedicine application and verified that I am speaking with the correct person using two identifiers.  Location: Patient: Home Provider: Clinic   I discussed the limitations of evaluation and management by telemedicine and the availability of in person appointments. The patient expressed understanding and agreed to proceed.  I provided 30 minutes of non-face-to-face time during this encounter.    08/15/2021 10:27 AM Carmen Coleman  MRN:  [id number]  Chief Complaint: "I am getting use to new things and setting boundaries"  HPI: 60 year old female seen today for follow-up psychiatric evaluation.   She has a psychiatric history of depression, anxiety, bipolar disorder, and ADHD.  She is currently managed on Vyvanse 40 mg daily and trazodone 50 to 100 mg nightly.  She notes her medications are somewhat effective in managing her psychiatric conditions.  Today she is well groomed, pleasant, cooperative, engaged in conversation, and maintained eye contact.  She informed Clinical research associate that  she is getting use to new things and setting boundaries. Patient notes that she is in [place] with her mother and is considering staying long term. She notes that at times she feels that she is starting life over as she is not financially where she would like to be. 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 Patient notes that she is learning to take care of herself as she has taken care of others all her life. She continues to care for her elderly mother but notes she is trying to set boundaries so that she does not overwhelm herself.  Patient notes that the above worsen her anxiety and depression. She also notes that she has  difficulty sleeping. She notes that she slept 2 or three hours last night. Today she denies SI/HI/VAH, mania, or paranoia.  She endorses adequate appetite.  Today patient is agreeable to increase Trazodone to help manage anxiety, depression, and sleep. She notes that she has tried Mirtazapine as well as other antidepressants and sleep aids. She notes that Ambien made her feel crazy. Provider discussed potentially trying Seroquel if increase in Trazodone is not effective. Provider informed patient that if she decides to live in [place] full time and not [employer] she would have to find another psychiatric provider. She endorsed understanding and agreed. No other concerns noted at this time.    Visit Diagnosis:    ICD-10-CM   1. Attention deficit hyperactivity disorder (ADHD), combined type  F90.2 lisdexamfetamine (VYVANSE) 40 MG capsule    2. Anxiety  F41.9 traZODone (DESYREL) 150 MG tablet      Past Psychiatric History:  Anxiety, ADHD, Depression, and Bipolar disorder  Past Medical History:  Past Medical History:  Diagnosis Date   ADHD    Arthritis    Depression    Vitreous detachment     Past Surgical History:  Procedure Laterality Date   APPENDECTOMY  1968   STRABISMUS SURGERY  1963   STRABISMUS SURGERY  2002   TOTAL VAGINAL HYSTERECTOMY  2017    Family Psychiatric History: denied  Family History:  Family History  Problem Relation Age of Onset   Hypertension Father    Alzheimer's disease Father    Heart failure Father    Coronary  artery disease Father    Pulmonary embolism Sister    Diabetes Maternal Grandmother    Lymphoma Other    Breast cancer Cousin     Social History:  Social History   Socioeconomic History   Marital status: Widowed    Spouse name: Not on file   Number of children: Not on file   Years of education: Not on file   Highest education level: Bachelor's degree (e.g., BA, AB, BS)  Occupational History   Not on file  Tobacco Use    Smoking status: Never   Smokeless tobacco: Never  Substance and Sexual Activity   Alcohol use: Never   Drug use: Never   Sexual activity: Not Currently  Other Topics Concern   Not on file  Social History Narrative   Moved to [place] from [place] in 2021. Retired Child psychotherapist.    Social Determinants of Health   Financial Resource Strain: Not on file  Food Insecurity: Not on file  Transportation Needs: Not on file  Physical Activity: Not on file  Stress: Not on file  Social Connections: Not on file    Allergies:  Allergies  Allergen Reactions   Bactrim [Sulfamethoxazole-Trimethoprim]     Metabolic Disorder Labs: No results found for: HGBA1C, MPG No results found for: PROLACTIN Lab Results  Component Value Date   CHOL 260 (H) 02/28/2021   TRIG 138 02/28/2021   HDL 67 02/28/2021   CHOLHDL 3.9 02/28/2021   LDLCALC 168 (H) 02/28/2021   No results found for: TSH  Therapeutic Level Labs: No results found for: LITHIUM No results found for: VALPROATE No components found for:  CBMZ  Current Medications: Current Outpatient Medications  Medication Sig Dispense Refill   diclofenac Sodium (VOLTAREN) 1 % GEL Apply 2 g topically 4 (four) times daily as needed. 350 g 3   lisdexamfetamine (VYVANSE) 40 MG capsule Take 1 capsule (40 mg total) by mouth every morning. 30 capsule 0   traZODone (DESYREL) 150 MG tablet Take 1 tablet (150 mg total) by mouth at bedtime as needed for sleep. 30 tablet 3   No current facility-administered medications for this visit.     Musculoskeletal: Strength & Muscle Tone:  Unable to assess due to telehealth visit Gait & Station: Unable to assess due to telehealth visit Patient leans: N/A  Psychiatric Specialty Exam: Review of Systems  There were no vitals taken for this visit.There is no height or weight on file to calculate BMI.  General Appearance: Well Groomed  Eye Contact:  Good  Speech:  Clear and Coherent and Normal Rate  Volume:   Normal  Mood:  Euthymic  Affect:  Appropriate and Congruent  Thought Process:  Coherent, Goal Directed, and Linear  Orientation:  Full (Time, Place, and Person)  Thought Content: WDL and Logical   Suicidal Thoughts:  No  Homicidal Thoughts:  No  Memory:  Immediate;   Good Recent;   Good Remote;   Good  Judgement:  Good  Insight:  Good  Psychomotor Activity:  Normal  Concentration:  Concentration: Good and Attention Span: Good  Recall:  Good  Fund of Knowledge: Good  Language: Good  Akathisia:  No  Handed:  Right  AIMS (if indicated): not done  Assets:  Communication Skills Desire for Improvement Financial Resources/Insurance Housing Leisure Time Social Support  ADL's:  Intact  Cognition: WNL  Sleep:  Poor   Screenings: GAD-7    Flowsheet Row Video Visit from 05/09/2021 in [facility]  Total GAD-7 Score 5      PHQ2-9    Flowsheet Row Video Visit from 05/09/2021 in [facility] Office Visit from 03/14/2021 in [facility] Office Visit from 02/28/2021 in [facility]  PHQ-2 Total Score 5 4 3   PHQ-9 Total Score 13 10 13       Flowsheet Row ED from 03/11/2021 in [facility] RISK CATEGORY No Risk        Assessment and Plan: Patient endorses symptoms of insomnia. She also notes that new life changes worsen her anxiety and depression. Today patient is agreeable to increase Trazodone to help manage anxiety, depression, and sleep. She notes that she has tried Mirtazapine as well as other antidepressants and sleep aids. She notes that Ambien made her feel crazy. Provider discussed potentially trying Seroquel if increase in Trazodone is not effective. Provider informed patient that if she decides to live in [facility] full time and not [facility] she would have to find another psychiatric provider. She endorsed understanding and agreed.  1.  Attention deficit hyperactivity disorder (ADHD), combined type  Continue- lisdexamfetamine (VYVANSE) 40 MG capsule; Take 1 capsule (40 mg total) by mouth every morning.  Dispense: 30 capsule; Refill: 0  2. Anxiety  Increased- traZODone (DESYREL) 150 MG tablet; Take 1 tablet (150 mg total) by mouth at bedtime as needed for sleep.  Dispense: 60 tablet; Refill: 3  Follow-up in 3 months   [place], NP 08/15/2021, 10:27 AM

## 2021-11-06 ENCOUNTER — Encounter (HOSPITAL_COMMUNITY): Payer: Self-pay

## 2021-11-06 ENCOUNTER — Telehealth (HOSPITAL_COMMUNITY): Payer: Federal, State, Local not specified - PPO | Admitting: Psychiatry

## 2022-03-03 ENCOUNTER — Encounter: Payer: Self-pay | Admitting: *Deleted

## 2023-03-02 IMAGING — MG DIGITAL SCREENING BILAT W/ CAD
4 series · 4 of 4 positions shown · non-contrast
Comparison: Previous exam(s).

CLINICAL DATA: Screening.

EXAM:
DIGITAL SCREENING BILATERAL MAMMOGRAM WITH CAD
TECHNIQUE: Bilateral screening digital craniocaudal and mediolateral oblique
mammograms were obtained. The images were evaluated with
computer-aided detection.

[R MLO]
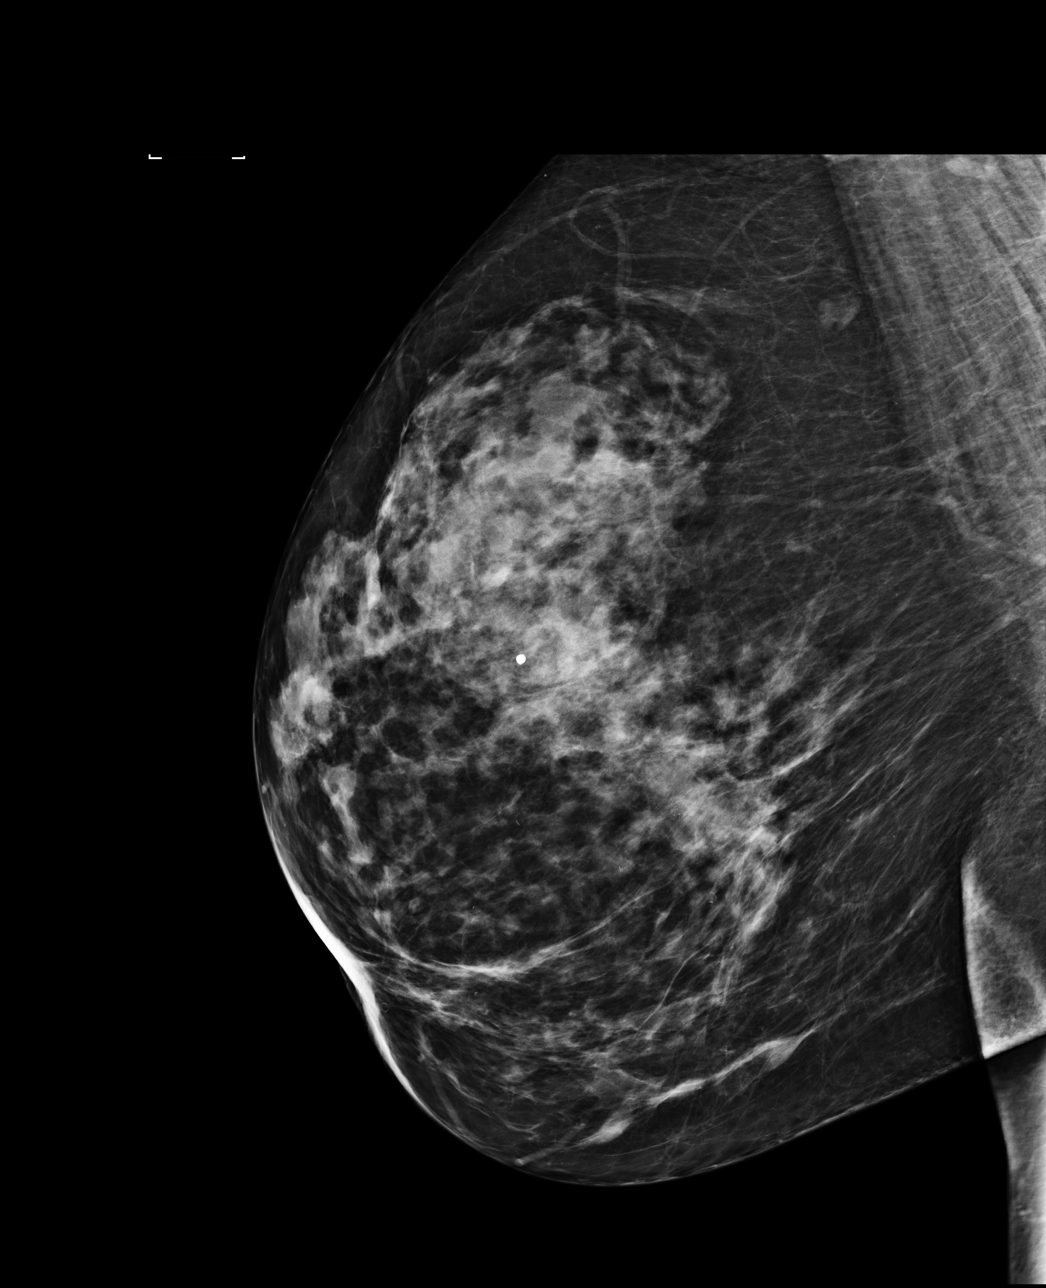

[R CC]
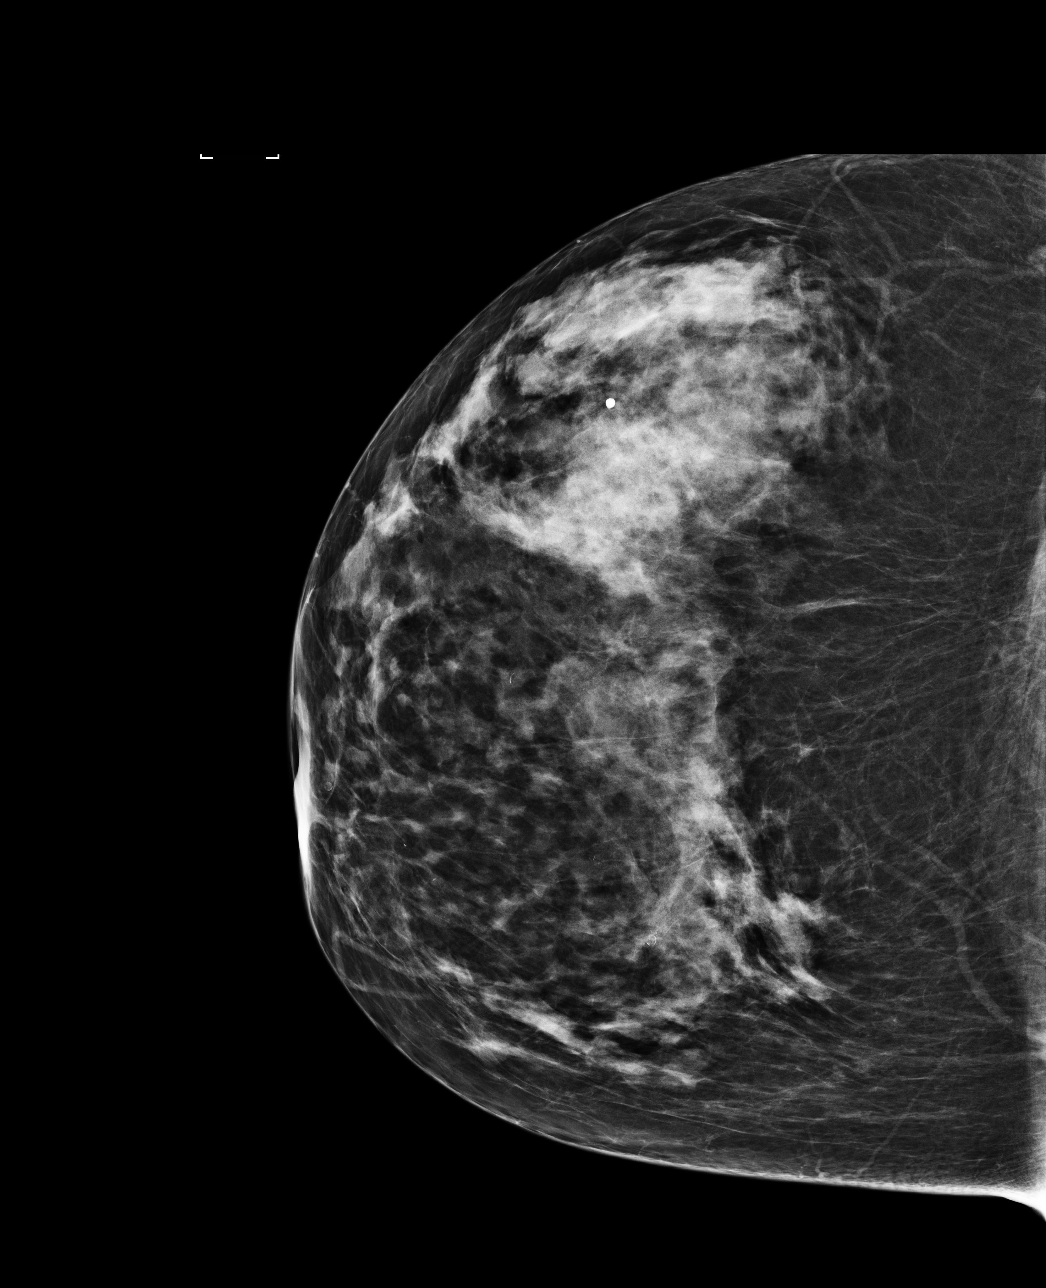

[L CC]
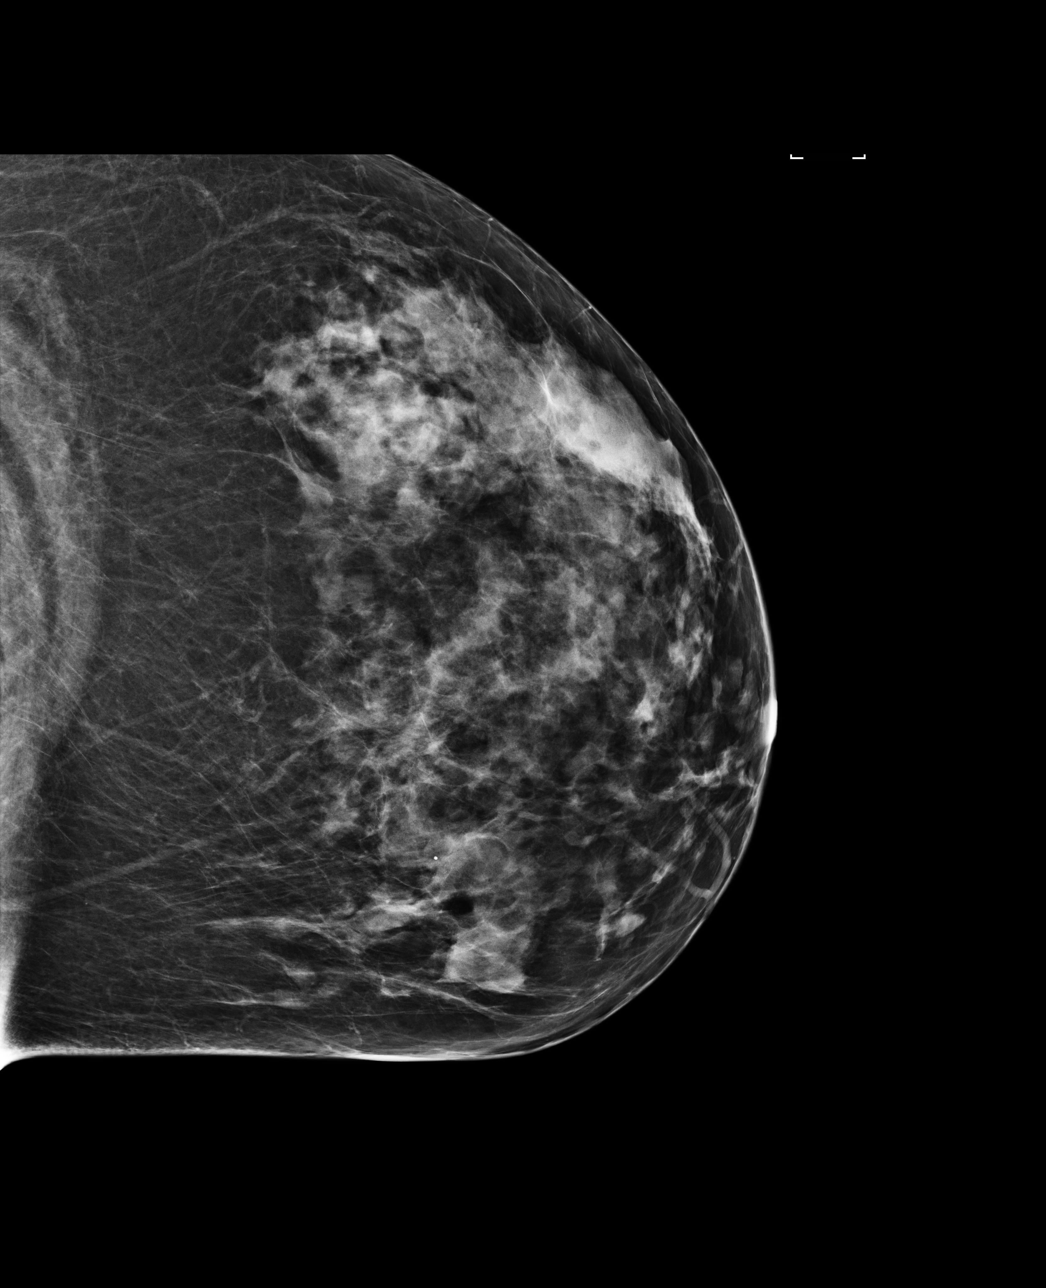

[L MLO]
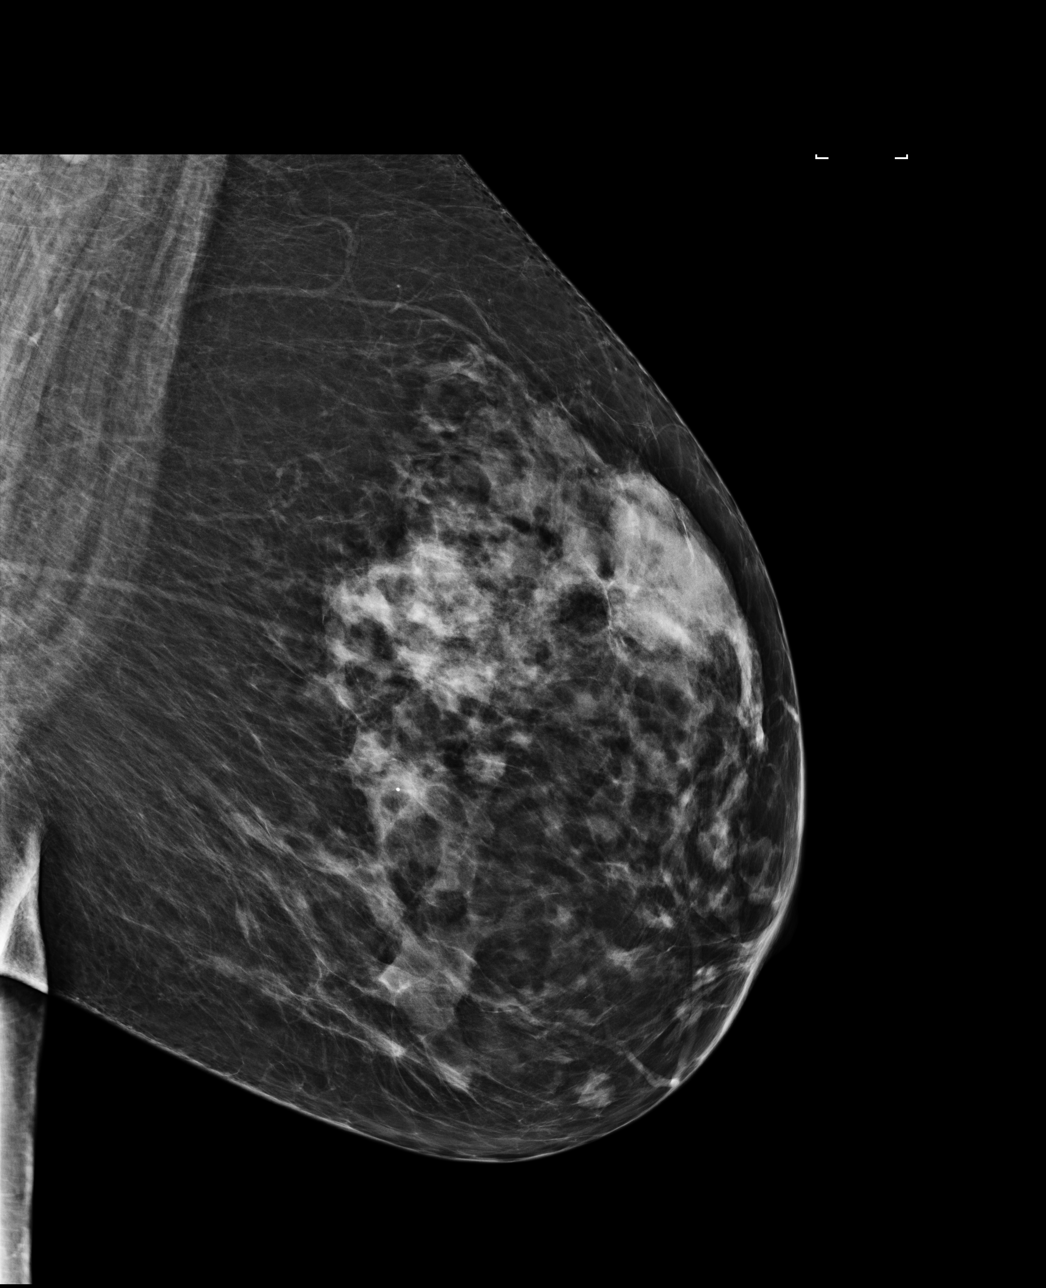

[4 of 4 positions shown; findings below may reference images not displayed]

ACR Breast Density Category c: The breast tissue is heterogeneously
dense, which may obscure small masses.
FINDINGS: There are no findings suspicious for malignancy.
IMPRESSION: No mammographic evidence of malignancy. A result letter of this
screening mammogram will be mailed directly to the patient.

RECOMMENDATION:
Screening mammogram in one year. (Code:TJ-B-ZPA)

BI-RADS CATEGORY  1: Negative.
